# Patient Record
Sex: Female | Born: 1975 | Race: White | Hispanic: No | Marital: Married | Smoking: Never smoker
Health system: Southern US, Community
[De-identification: ages and names within clinical notes are randomized; demographics above are authoritative.]

## PROBLEM LIST (undated history)

## (undated) ENCOUNTER — Inpatient Hospital Stay (HOSPITAL_COMMUNITY): Payer: Self-pay

## (undated) DIAGNOSIS — D689 Coagulation defect, unspecified: Secondary | ICD-10-CM

## (undated) DIAGNOSIS — F419 Anxiety disorder, unspecified: Secondary | ICD-10-CM

## (undated) DIAGNOSIS — T7840XA Allergy, unspecified, initial encounter: Secondary | ICD-10-CM

## (undated) DIAGNOSIS — F329 Major depressive disorder, single episode, unspecified: Secondary | ICD-10-CM

## (undated) DIAGNOSIS — L900 Lichen sclerosus et atrophicus: Secondary | ICD-10-CM

## (undated) DIAGNOSIS — F32A Depression, unspecified: Secondary | ICD-10-CM

## (undated) DIAGNOSIS — Z98891 History of uterine scar from previous surgery: Secondary | ICD-10-CM

## (undated) HISTORY — DX: Anxiety disorder, unspecified: F41.9

## (undated) HISTORY — DX: Allergy, unspecified, initial encounter: T78.40XA

## (undated) HISTORY — DX: Major depressive disorder, single episode, unspecified: F32.9

## (undated) HISTORY — DX: Depression, unspecified: F32.A

---

## 1998-10-16 ENCOUNTER — Ambulatory Visit (HOSPITAL_COMMUNITY): Admission: RE | Admit: 1998-10-16 | Discharge: 1998-10-16 | Payer: Self-pay | Admitting: Family Medicine

## 1998-10-16 ENCOUNTER — Encounter: Payer: Self-pay | Admitting: Family Medicine

## 2004-01-05 ENCOUNTER — Ambulatory Visit: Payer: Self-pay | Admitting: Internal Medicine

## 2004-02-06 ENCOUNTER — Other Ambulatory Visit: Admission: RE | Admit: 2004-02-06 | Discharge: 2004-02-06 | Payer: Self-pay | Admitting: Obstetrics and Gynecology

## 2004-02-12 ENCOUNTER — Encounter: Admission: RE | Admit: 2004-02-12 | Discharge: 2004-02-12 | Payer: Self-pay | Admitting: Obstetrics and Gynecology

## 2005-04-11 ENCOUNTER — Other Ambulatory Visit: Admission: RE | Admit: 2005-04-11 | Discharge: 2005-04-11 | Payer: Self-pay | Admitting: Obstetrics and Gynecology

## 2005-08-08 ENCOUNTER — Ambulatory Visit: Payer: Self-pay | Admitting: Internal Medicine

## 2006-02-13 ENCOUNTER — Ambulatory Visit: Payer: Self-pay | Admitting: Internal Medicine

## 2006-08-04 ENCOUNTER — Encounter: Payer: Self-pay | Admitting: Internal Medicine

## 2006-08-13 ENCOUNTER — Ambulatory Visit: Payer: Self-pay | Admitting: Internal Medicine

## 2006-08-13 DIAGNOSIS — K120 Recurrent oral aphthae: Secondary | ICD-10-CM | POA: Insufficient documentation

## 2006-08-13 DIAGNOSIS — H8110 Benign paroxysmal vertigo, unspecified ear: Secondary | ICD-10-CM

## 2008-06-15 ENCOUNTER — Ambulatory Visit (HOSPITAL_COMMUNITY): Admission: RE | Admit: 2008-06-15 | Discharge: 2008-06-15 | Payer: Self-pay | Admitting: Gynecology

## 2008-06-28 ENCOUNTER — Ambulatory Visit: Payer: Self-pay | Admitting: Oncology

## 2008-07-05 LAB — CBC WITH DIFFERENTIAL (CANCER CENTER ONLY)
BASO#: 0.1 10*3/uL (ref 0.0–0.2)
EOS%: 5.4 % (ref 0.0–7.0)
MCH: 30.8 pg (ref 26.0–34.0)
MCHC: 34.6 g/dL (ref 32.0–36.0)
MONO%: 5.4 % (ref 0.0–13.0)
NEUT#: 4.2 10*3/uL (ref 1.5–6.5)
Platelets: 206 10*3/uL (ref 145–400)

## 2008-07-05 LAB — CMP (CANCER CENTER ONLY)
ALT(SGPT): 26 U/L (ref 10–47)
Alkaline Phosphatase: 68 U/L (ref 26–84)
CO2: 26 mEq/L (ref 18–33)
Sodium: 141 mEq/L (ref 128–145)
Total Bilirubin: 0.6 mg/dl (ref 0.20–1.60)
Total Protein: 8 g/dL (ref 6.4–8.1)

## 2008-07-10 LAB — HYPERCOAGULABLE PANEL, COMPREHENSIVE RET.
DRVVT: 39 secs (ref 36.1–47.0)
Homocysteine: 5.6 umol/L (ref 4.0–15.4)
Protein C Activity: 118 % (ref 75–133)
Protein S Activity: 86 % (ref 69–129)
Protein S Ag, Total: 107 % (ref 70–140)

## 2008-07-28 ENCOUNTER — Ambulatory Visit: Payer: Self-pay | Admitting: Family Medicine

## 2008-07-28 ENCOUNTER — Telehealth: Payer: Self-pay | Admitting: Family Medicine

## 2008-07-28 DIAGNOSIS — B354 Tinea corporis: Secondary | ICD-10-CM | POA: Insufficient documentation

## 2008-07-28 DIAGNOSIS — B009 Herpesviral infection, unspecified: Secondary | ICD-10-CM | POA: Insufficient documentation

## 2008-10-23 ENCOUNTER — Telehealth: Payer: Self-pay | Admitting: Family Medicine

## 2008-11-16 ENCOUNTER — Ambulatory Visit: Payer: Self-pay | Admitting: Oncology

## 2009-02-06 ENCOUNTER — Emergency Department (HOSPITAL_COMMUNITY): Admission: EM | Admit: 2009-02-06 | Discharge: 2009-02-06 | Payer: Self-pay | Admitting: Emergency Medicine

## 2009-03-16 ENCOUNTER — Ambulatory Visit: Payer: Self-pay | Admitting: Oncology

## 2009-03-20 LAB — CBC WITH DIFFERENTIAL (CANCER CENTER ONLY)
BASO#: 0.1 10*3/uL (ref 0.0–0.2)
BASO%: 0.8 % (ref 0.0–2.0)
EOS%: 6.5 % (ref 0.0–7.0)
Eosinophils Absolute: 0.6 10*3/uL — ABNORMAL HIGH (ref 0.0–0.5)
HCT: 41.2 % (ref 34.8–46.6)
HGB: 14 g/dL (ref 11.6–15.9)
LYMPH#: 2.7 10*3/uL (ref 0.9–3.3)
LYMPH%: 30.2 % (ref 14.0–48.0)
MCH: 30.7 pg (ref 26.0–34.0)
MCHC: 33.9 g/dL (ref 32.0–36.0)
MCV: 91 fL (ref 81–101)
MONO#: 0.5 10*3/uL (ref 0.1–0.9)
MONO%: 5.5 % (ref 0.0–13.0)
NEUT#: 5 10*3/uL (ref 1.5–6.5)
NEUT%: 57 % (ref 39.6–80.0)
Platelets: 196 10*3/uL (ref 145–400)
RBC: 4.55 10*6/uL (ref 3.70–5.32)
RDW: 11.4 % (ref 10.5–14.6)
WBC: 8.8 10*3/uL (ref 3.9–10.0)

## 2009-03-25 LAB — HYPERCOAGULABLE PANEL, COMPREHENSIVE
AntiThromb III Func: 112 % (ref 76–126)
Anticardiolipin IgA: <3 APL U/mL (ref <10)
Anticardiolipin IgG: 3 GPL U/mL (ref <10)
Anticardiolipin IgM: <3 MPL U/mL (ref <10)
Beta-2 Glyco I IgG: <3 U/mL (ref <=15)
Beta-2-Glycoprotein I IgA: <3 U/mL (ref <=15)
Beta-2-Glycoprotein I IgM: <3 U/mL (ref <=15)
DRVVT: 40.3 secs (ref 36.2–44.3)
Homocysteine: 4.2 umol/L (ref 4.0–15.4)
PTT Lupus Anticoagulant: 36.9 secs (ref 32.0–43.4)
Protein C Activity: 176 % — ABNORMAL HIGH (ref 75–133)
Protein C, Total: 111 % (ref 70–140)
Protein S Activity: 81 % (ref 69–129)
Protein S Ag, Total: 107 % (ref 70–140)

## 2009-05-31 ENCOUNTER — Ambulatory Visit: Payer: Self-pay | Admitting: Internal Medicine

## 2009-05-31 DIAGNOSIS — L738 Other specified follicular disorders: Secondary | ICD-10-CM | POA: Insufficient documentation

## 2009-05-31 DIAGNOSIS — J309 Allergic rhinitis, unspecified: Secondary | ICD-10-CM | POA: Insufficient documentation

## 2009-06-01 ENCOUNTER — Telehealth: Payer: Self-pay | Admitting: Internal Medicine

## 2009-06-12 ENCOUNTER — Telehealth: Payer: Self-pay | Admitting: Internal Medicine

## 2009-09-04 ENCOUNTER — Telehealth: Payer: Self-pay | Admitting: Internal Medicine

## 2010-02-12 NOTE — Progress Notes (Signed)
Summary: Lodrane  Phone Note Call from Patient   Caller: Patient Call For: Gordy Savers  MD Summary of Call: [facility] ([place]) Pt likes the Lodrane and would like a prescription, please.  Initial call taken by: Lynann Beaver CMA,  September 04, 2009 9:19 AM  Follow-up for Phone Call        OK  #50  one two times a day as needed  RF 6 Follow-up by: Gordy Savers  MD,  September 04, 2009 12:38 PM    New/Updated Medications: LODRANE 12 HOUR 6 MG XR12H-TAB (BROMPHENIRAMINE MALEATE) one by mouth bid Prescriptions: LODRANE 12 HOUR 6 MG XR12H-TAB (BROMPHENIRAMINE MALEATE) one by mouth bid  #50 x 6   Entered by:   Lynann Beaver CMA   Authorized by:   Gordy Savers  MD   Signed by:   Lynann Beaver CMA on 09/04/2009   Method used:   Electronically to        [id number]* (retail)       [address]       [place], [place]  [address]       Ph: [phone]       Fax: [phone]   RxID:   [id number]   Appended Document: [place]    Prescriptions: LODRANE 12 HOUR 6 MG XR12H-TAB (BROMPHENIRAMINE MALEATE) one by mouth bid  #50 x 6   Entered by:   Lynann Beaver CMA   Authorized by:   Gordy Savers  MD   Signed by:   Lynann Beaver CMA on 09/06/2009   Method used:   Electronically to        [facility] [id number] (retail)       [address]       [place], [place]  [address]       Ph: [phone] or [phone]       Fax: [phone]   RxID:   [id number]  [facility] did not have this medication.

## 2010-02-12 NOTE — Assessment & Plan Note (Signed)
Summary: ?allergies/njr   Vital Signs:  Patient profile:   35 year old female Weight:      214 pounds Temp:     98.1 degrees F oral BP sitting:   130 / 74  (left arm) Cuff size:   regular  Vitals Entered By: Duard Brady LPN (May 31, 2009 4:41 PM) CC: c/o allergy sx , benadryl not helping , also c/o 'knot' in groin area Is Patient Diabetic? No   CC:  c/o allergy sx , benadryl not helping , and also c/o 'knot' in groin area.  History of Present Illness: 5 -year-old patient who is in today complaining of worsening allergies over the past 4 days.  She seemed somewhat improved today and has been using Benadryl.  This week.  She has benefited greatly from Flonase nasal spray and attached. She also has concerns about scattered areas of folliculitis in the groin region. She has been burred to [facility] for further evaluation of frequent miscarriages  Preventive Screening-Counseling & Management  Alcohol-Tobacco     Smoking Status: never  Allergies (verified): No Known Drug Allergies  Past History:  Past Medical History: Allergic rhinitis recurrent miscarriages  Social History: Smoking Status:  never  Physical Exam  General:  overweight-appearing.   Head:  Normocephalic and atraumatic without obvious abnormalities. No apparent alopecia or balding. Eyes:  No corneal or conjunctival inflammation noted. EOMI. Perrla. Funduscopic exam benign, without hemorrhages, exudates or papilledema. Vision grossly normal. Ears:  External ear exam shows no significant lesions or deformities.  Otoscopic examination reveals clear canals, tympanic membranes are intact bilaterally without bulging, retraction, inflammation or discharge. Hearing is grossly normal bilaterally. Nose:  External nasal examination shows no deformity or inflammation. Nasal mucosa are pink and moist without lesions or exudates. Mouth:  Oral mucosa and oropharynx without lesions or exudates.  Teeth  in good repair. Neck:  No deformities, masses, or tenderness noted. Lungs:  Normal respiratory effort, chest expands symmetrically. Lungs are clear to auscultation, no crackles or wheezes. Heart:  Normal rate and regular rhythm. S1 and S2 normal without gallop, murmur, click, rub or other extra sounds. Skin:  scattered areas of folliculitis in the right groin and pubic areas    Impression & Recommendations:  Problem # 1:  ALLERGIC RHINITIS (ICD-477.9)  Her updated medication list for this problem includes:    Fluticasone Propionate 50 Mcg/act Susp (Fluticasone propionate) ..... Use daily will add a  once a day nonsedating antihistamine  Problem # 2:  FOLLICULITIS (ICD-704.8) local skin care discussed  Complete Medication List: 1)  Valtrex 1 Gm Tabs (Valacyclovir hcl) .... Use as directed as needed cold sores 2)  Folic Acid 800 Mcg Tabs (Folic acid) .... Qd 3)  Vitamin B-12 100 Mcg Tabs (Cyanocobalamin) .... Qd 4)  Aspir-low 81 Mg Tbec (Aspirin) .... Qd 5)  Progesterone  .... When ovulating 6)  Lovenox  .... As directed 7)  Fluticasone Propionate 50 Mcg/act Susp (Fluticasone propionate) .... Use daily  Patient Instructions: 1)  Please schedule a follow-up appointment as needed. Prescriptions: FLUTICASONE PROPIONATE 50 MCG/ACT SUSP (FLUTICASONE PROPIONATE) use daily  #1 x 4   Entered and Authorized by:   Gordy Savers  MD   Signed by:   Gordy Savers  MD on 05/31/2009   Method used:   Electronically to        [id number] (retail)       [address]  [place], [place]  [address]       Ph: [phone]       Fax: [phone]   RxID:   [id number]

## 2010-02-12 NOTE — Progress Notes (Signed)
Summary: staph  Phone Note Call from Patient   Summary of Call: Went to dermatologist for places on bikini line, got very painful & were lanced,  turned out to be staph, on 2nd round of antibiotic - doxy & something related to Penicillin that starts with c or k by sound.   Wanted this in her file in case she comes back & has something similar.   Initial call taken by: Rudy Jew, RN,  Jun 12, 2009 4:21 PM  Follow-up for Phone Call        noted Follow-up by: Gordy Savers  MD,  Jun 12, 2009 5:23 PM

## 2010-02-12 NOTE — Progress Notes (Signed)
Summary: sig clarification flonase  Phone Note Refill Request   Refills Requested: Medication #1:  FLUTICASONE PROPIONATE 50 MCG/ACT SUSP use daily. sig [facility] -   [facility] [address]  [phone]   Method Requested: Fax to Local Pharmacy Initial call taken by: Duard Brady LPN,  Jun 01, 2009 11:03 AM    New/Updated Medications: FLUTICASONE PROPIONATE 50 MCG/ACT SUSP (FLUTICASONE PROPIONATE) 2 sprays once daily each nostril Prescriptions: FLUTICASONE PROPIONATE 50 MCG/ACT SUSP (FLUTICASONE PROPIONATE) 2 sprays once daily each nostril  #16gm x 4   Entered by:   Duard Brady LPN   Authorized by:   Gordy Savers  MD   Signed by:   Duard Brady LPN on 01/15/7251   Method used:   Faxed to ...       [facility] [address] (retail)       [address]       [place], [place]  [address]       Ph: [phone]       Fax: [phone]   RxID:   [id number]  [facility]ed clarification to [facility]

## 2010-04-01 LAB — HCG, QUANTITATIVE, PREGNANCY: hCG, Beta Chain, Quant, S: 107 m[IU]/mL — ABNORMAL HIGH (ref <5)

## 2010-04-01 LAB — TYPE AND SCREEN: Antibody Screen: NEGATIVE

## 2010-06-05 ENCOUNTER — Other Ambulatory Visit: Payer: Self-pay | Admitting: Obstetrics and Gynecology

## 2010-06-05 ENCOUNTER — Inpatient Hospital Stay (HOSPITAL_COMMUNITY)
Admission: AD | Admit: 2010-06-05 | Discharge: 2010-06-09 | DRG: 651 | Disposition: A | Discharge status: Home / Self Care | Payer: BC Managed Care – PPO | Source: Ambulatory Visit | Attending: Obstetrics & Gynecology | Admitting: Obstetrics & Gynecology

## 2010-06-05 DIAGNOSIS — O1414 Severe pre-eclampsia complicating childbirth: Principal | ICD-10-CM | POA: Diagnosis present

## 2010-06-05 LAB — CBC
MCH: 31.1 pg (ref 26.0–34.0)
Platelets: 121 10*3/uL — ABNORMAL LOW (ref 150–400)
RBC: 4.37 MIL/uL (ref 3.87–5.11)
RDW: 13.6 % (ref 11.5–15.5)
WBC: 10.7 10*3/uL — ABNORMAL HIGH (ref 4.0–10.5)

## 2010-06-05 LAB — COMPREHENSIVE METABOLIC PANEL
AST: 66 U/L — ABNORMAL HIGH (ref 0–37)
Albumin: 2.3 g/dL — ABNORMAL LOW (ref 3.5–5.2)
BUN: 24 mg/dL — ABNORMAL HIGH (ref 6–23)
Chloride: 102 mEq/L (ref 96–112)
Creatinine, Ser: 1.21 mg/dL — ABNORMAL HIGH (ref 0.4–1.2)
GFR calc Af Amer: >60 mL/min (ref >60)
Potassium: 4.1 mEq/L (ref 3.5–5.1)
Total Bilirubin: 0.7 mg/dL (ref 0.3–1.2)
Total Protein: 6.6 g/dL (ref 6.0–8.3)

## 2010-06-05 LAB — APTT: aPTT: 38 seconds — ABNORMAL HIGH (ref 24–37)

## 2010-06-05 LAB — MRSA PCR SCREENING

## 2010-06-05 LAB — LACTATE DEHYDROGENASE: LDH: 242 U/L (ref 94–250)

## 2010-06-05 LAB — MAGNESIUM: Magnesium: 5.9 mg/dL — ABNORMAL HIGH (ref 1.5–2.5)

## 2010-06-05 LAB — PROTIME-INR: Prothrombin Time: 12.6 seconds (ref 11.6–15.2)

## 2010-06-06 LAB — MAGNESIUM: Magnesium: 5.9 mg/dL — ABNORMAL HIGH (ref 1.5–2.5)

## 2010-06-06 LAB — COMPREHENSIVE METABOLIC PANEL
Albumin: 1.9 g/dL — ABNORMAL LOW (ref 3.5–5.2)
BUN: 21 mg/dL (ref 6–23)
Calcium: 7.8 mg/dL — ABNORMAL LOW (ref 8.4–10.5)
Creatinine, Ser: 1.24 mg/dL — ABNORMAL HIGH (ref 0.4–1.2)
Glucose, Bld: 109 mg/dL — ABNORMAL HIGH (ref 70–99)
Potassium: 5.4 mEq/L — ABNORMAL HIGH (ref 3.5–5.1)
Total Protein: 5.6 g/dL — ABNORMAL LOW (ref 6.0–8.3)

## 2010-06-06 LAB — CBC
Hemoglobin: 10.2 g/dL — ABNORMAL LOW (ref 12.0–15.0)
MCH: 30.8 pg (ref 26.0–34.0)
Platelets: 128 10*3/uL — ABNORMAL LOW (ref 150–400)
RBC: 3.31 MIL/uL — ABNORMAL LOW (ref 3.87–5.11)
WBC: 16.7 10*3/uL — ABNORMAL HIGH (ref 4.0–10.5)

## 2010-06-06 LAB — URIC ACID: Uric Acid, Serum: 7.3 mg/dL — ABNORMAL HIGH (ref 2.4–7.0)

## 2010-06-06 LAB — LACTATE DEHYDROGENASE: LDH: 290 U/L — ABNORMAL HIGH (ref 94–250)

## 2010-06-06 LAB — RPR: RPR Ser Ql: NONREACTIVE

## 2010-06-07 LAB — CBC
Hemoglobin: 9 g/dL — ABNORMAL LOW (ref 12.0–15.0)
MCH: 30.4 pg (ref 26.0–34.0)
MCHC: 31.8 g/dL (ref 30.0–36.0)
RDW: 14.5 % (ref 11.5–15.5)

## 2010-06-07 LAB — COMPREHENSIVE METABOLIC PANEL
AST: 48 U/L — ABNORMAL HIGH (ref 0–37)
CO2: 20 mEq/L (ref 19–32)
Calcium: 7.9 mg/dL — ABNORMAL LOW (ref 8.4–10.5)
Creatinine, Ser: 1.33 mg/dL — ABNORMAL HIGH (ref 0.4–1.2)
GFR calc Af Amer: 56 mL/min — ABNORMAL LOW (ref >60)
GFR calc non Af Amer: 46 mL/min — ABNORMAL LOW (ref >60)
Glucose, Bld: 78 mg/dL (ref 70–99)
Total Protein: 5.7 g/dL — ABNORMAL LOW (ref 6.0–8.3)

## 2010-06-07 LAB — URIC ACID: Uric Acid, Serum: 7.7 mg/dL — ABNORMAL HIGH (ref 2.4–7.0)

## 2010-06-08 LAB — COMPREHENSIVE METABOLIC PANEL
ALT: 38 U/L — ABNORMAL HIGH (ref 0–35)
Albumin: 2 g/dL — ABNORMAL LOW (ref 3.5–5.2)
Alkaline Phosphatase: 282 U/L — ABNORMAL HIGH (ref 39–117)
Chloride: 105 mEq/L (ref 96–112)
Potassium: 5 mEq/L (ref 3.5–5.1)
Sodium: 136 mEq/L (ref 135–145)
Total Protein: 5.6 g/dL — ABNORMAL LOW (ref 6.0–8.3)

## 2010-06-08 LAB — MRSA CULTURE

## 2010-06-14 NOTE — H&P (Signed)
  Mariah Brown, Mariah Brown            ACCOUNT NO.:  [id number]  MEDICAL RECORD NO.:  [id number]           PATIENT TYPE:  I  LOCATION:  9372                          [facility]  PHYSICIAN:  Lenoard Aden, M.D.DATE OF BIRTH:  Sep 10, 1975  DATE OF ADMISSION:  06/05/2010 DATE OF DISCHARGE:                             HISTORY & PHYSICAL   CHIEF COMPLAINT:  HELLP.  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  She has no known latex allergy.  ALLERGIES:  To MACROBID.  PAST MEDICAL HISTORY:  She has a medical remarkable for anxiety, recurrent pregnancy loss, and PAI-1 mutation.  She has a past medical history also remarkable for elective sclerosis. SOCIAL HISTORY:  Noncontributory.  She is a nonsmoker, nondrinker.  She denies domestic or physical violence.  MEDICATIONS:  Prenatal vitamins and unfractionated heparin, last dose greater than 12 hours ago 10,000 units t.i.d., previously taken.  She has a history of 5 previous pregnancy losses without D and C.  SURGICAL HISTORY:  Noncontributory.  Prenatal course is complicated by size discrepancy and new-onset elevated blood pressure, proteinuria, and abnormal labs as noted.  PHYSICAL EXAMINATION:  GENERAL:  She is a well-developed, well-nourished white female in no acute distress. HEENT:  Normal. NECK:  Supple.  Full range of motion. LUNGS:  Clear. HEART:  Regular rhythm. ABDOMEN:  Soft, gravid, and nontender.  No CVA tenderness. EXTREMITIES:  No cords. NEUROLOGICAL:  Exam is nonfocal. SKIN:  Intact. EXTREMITIES:  DTRs 2+.  No evidence of clonus, 1+ pretibial edema noted. PELVIC:  Cervix closed, thick, vertex and -2 to -3.  LABORATORY DATA:  Laboratory  values reveal platelet count of 121,000 decreased from 140,000 twelve hours ago.  Uric acid 7.3 up from 7.  LDH of 242.  PT/PTT normal.  LFTs now reveal an SGOT of 66, SGPT of 53, and a creatinine of 1.2.  NST is reactive.  IMPRESSION: 1. This is a 35-3/7th week intrauterine pregnancy. 2. New-onset preeclampsia with probable hemolysis, elevated liver     enzymes, and low platelet syndrome. 3. Unfavorable cervix.  PLAN:  Options discussed.  Case discussed with Dr. Particia Nearing of MFM. She concurs with the management option to include proceeding with delivery at this time.  Options of induction versus primary C-section discussed with potential high chance of needing a potential C-section due to unfavorable cervix and clinical scenario.  The patient acknowledges these options, consents were signed, and she desires to proceed with C-section.  Risks, benefits discussed.  Risks of anesthesia, infection, bleeding, injury to abdominal organs, and need for repair discussed, delayed versus immediate complications to include bowel and bladder injury noted.  Patient acknowledges and wishes to proceed.     Lenoard Aden, M.D.     RJT/MEDQ  D:  06/05/2010  T:  06/06/2010  Job:  [id number]  Electronically Signed by Olivia Mackie M.D. on 06/14/2010 02:02:13 PM

## 2010-06-14 NOTE — Op Note (Signed)
  Mariah Brown, Mariah Brown            ACCOUNT NO.:  [id number]  MEDICAL RECORD NO.:  [id number]           PATIENT TYPE:  I  LOCATION:  9372                          [facility]  PHYSICIAN:  Lenoard Aden, M.D.DATE OF BIRTH:  08-16-1975  DATE OF PROCEDURE:  06/05/2010 DATE OF DISCHARGE:                              OPERATIVE REPORT   PREOPERATIVE DIAGNOSIS:  35 and 3/7th week intrauterine pregnancy with new onset of severe preeclampsia with HELLP syndrome.  POSTOPERATIVE DIAGNOSIS:  35 and 3/7th week intrauterine pregnancy with new onset of severe preeclampsia with HELLP syndrome.  PROCEDURE:  Primary low-segment transverse cesarean section.  SURGEON:  Lenoard Aden, MD  ASSISTANT:  Marlinda Mike, CNM  ANESTHESIA:  Spinal by Dr. Jean Rosenthal.  ESTIMATED BLOOD LOSS:  1000 mL.  COMPLICATIONS:  None.  DRAINS:  Foley counts correct.  The patient recovery in good condition.  BRIEF OPERATIVE NOTE:  After being apprised of risks of anesthesia, infection, bleeding, injury to abdominal organs, need for repair, delayed versus immediate complications to include bowel and bladder injury, possible need for repair, the patient was brought to the operating room, she was administered spinal anesthetic without complications.  She was prepped and draped in usual sterile fashion. Foley catheter placed after achieving adequate anesthesia dilute Marcaine solution placed.  A Pfannenstiel skin incision made with the scalpel, carried down to fascia which was nicked in midline and opened transversely using Mayo scissors.  Rectus muscles dissected sharply in the midline.  Peritoneum entered sharply.  Bladder blade placed. Visceral peritoneum scored sharply off the lower uterine segment.  Sharl Ma hysterotomy incision made with atraumatic delivery of full-term living female, handed to pediatricians in attendance.  Apgars 6 and 8.  Cord blood collected.  Placenta delivered manually intact.   Uterus exteriorized, curetted using dry lap pack and closed in 2 running imbricating layers of 0 Monocryl suture.  Good hemostasis was noted. Bladder flap inspected, found to be hemostatic.  Irrigation accomplished.  Urine output is adequate and urine is clear.  At this time, uterus replaced in abdominal cavity.  Uterus normal size, shape, and contour.  Both tubes and ovaries appear normal.  The bladder flap and uterine incision reinspected, found to be hemostatic.  The fascia was then closed using 0-Monocryl in a running fashion.  Subcutaneous tissue reapproximated using 0-plain in a continuous running fashion.  Skin closed using staples.  The patient tolerated the procedure well and was transferred to recovery in good condition.     Lenoard Aden, M.D.     RJT/MEDQ  D:  06/05/2010  T:  06/06/2010  Job:  [id number]  Electronically Signed by Olivia Mackie M.D. on 06/14/2010 02:02:15 PM

## 2010-07-04 ENCOUNTER — Ambulatory Visit (HOSPITAL_COMMUNITY)
Admission: RE | Admit: 2010-07-04 | Discharge: 2010-07-04 | Disposition: A | Discharge status: Home / Self Care | Payer: BC Managed Care – PPO | Source: Ambulatory Visit | Attending: Obstetrics and Gynecology | Admitting: Obstetrics and Gynecology

## 2010-07-09 ENCOUNTER — Ambulatory Visit (HOSPITAL_COMMUNITY)
Admission: RE | Admit: 2010-07-09 | Discharge: 2010-07-09 | Disposition: A | Discharge status: Home / Self Care | Payer: BC Managed Care – PPO | Source: Ambulatory Visit | Attending: Obstetrics and Gynecology | Admitting: Obstetrics and Gynecology

## 2010-07-19 NOTE — Discharge Summary (Signed)
Mariah Brown, Mariah Brown            ACCOUNT NO.:  [id number]  MEDICAL RECORD NO.:  [id number]           PATIENT TYPE:  I  LOCATION:  9320                          [facility]  PHYSICIAN:  Lenoard Aden, M.D.DATE OF BIRTH:  [birth year]  DATE OF ADMISSION:  06/05/2010 DATE OF DISCHARGE:  06/09/2010                              DISCHARGE SUMMARY   ADMISSION DIAGNOSES: 1. Intrauterine pregnancy at 35 weeks and 3 days' gestation. 2. New-onset severe preeclampsia with hemolysis, elevated liver     enzymes, and low platelet syndrome.  DISCHARGE DIAGNOSES: 1. Postoperative day #4 status post primary cesarean section, stable. 2. Resolving hemolysis, elevated liver enzymes, and low platelet.  HISTORY:  The patient is a gravida 6, para 0-0-5-0 at 35 weeks and 3 days' gestation with [facility] of July 07, 2010.  Prenatal care was obtained at [facility] since 8 weeks and 2 days' gestation with primary as Dr. Billy Coast.  PRENATAL LABORATORY DATA:  O positive, rubella immune, GBS positive,  HIV negative, RPR non-reactive, Hep. B negative, 1hr GTT 114.  Prenatal course complicated by history of multiple pregnancy losses and recent diagnosis of PAI-1 mutation, on anticoagulant therapy with heparin.  MEDICAL/SURGICAL HISTORY: 1. Anxiety. 2. Recurrent pregnancy loss. 3. PAI-1 mutation.  ALLERGIES:  The patient is allergic to [facility].  CURRENT MEDICATIONS:  Prenatal vitamins and heparin 10,000 units t.i.d. subcutaneous.  PRESENTATION:  The patient presented for direct admit on Jun 05, 2010, with new onset of headache, nausea, abnormal liver function tests, and low platelets.  The patient had a reactive nonstress test and unfavorable cervix.  ADMISSION LABORATORY DATA:  WBCs 10.7, hemoglobin 13.6, hematocrit 41.0, and platelets 121 which had dropped from 140 twelve hours previous.  AST was 66, ALT 53, uric acid 7.3 - an increase from 7.0 twelve hours earlier. Creatinine was 1.21.  LDH 242.  PT  12.6, PTT 38, and INR of 0.92.  RPR negative and MRSA by PCR negative.  Maternal Fetal Medicine was consulted, Dr. Particia Nearing concurred with decision to proceed with expedited delivery at this time for severe preeclampsia and developing HELLP syndrome.  The patient was counseled of risks and benefits of cesarean section versus induction of labor and agreed with proceeding to delivery with a cesarean section.  SURGERY:  The patient was delivered via primary cesarean section on Jun 05, 2010, by Dr. Billy Coast for severe preeclampsia and HELLP syndrome with unfavorable cervix.  The patient was delivered of a female infant with a weight of 5 pounds and 7 ounces and Apgars of 6 and 8 and the newborn was transferred to NICU.  Please see operative note for further details.  POST-OP COURSE: was complicated by HELLP syndrome.  The patient was admitted to a ICU post recovery and was started on magnesium sulfate at 4 g loading dose and then 2 g an hour.  Magnesium level at 2200 the same evening was found to be 5.9 and magnesium was dropped to 1 g per hour. On postoperative day #1, WBCs were 16.7, hemoglobin 10.2, hematocrit 31.5, and platelets 128.  Creatinine increased to 1.24.  AST and ALT were 60 and 49.  LDH 290 and uric acid at 7.3.  The patient was stable in AICU, on magnesium sulfate, and urine output was showing good diuresis.  The patient was transferred to Step-Down Unit after 24 hours of observation and the magnesium sulfate infusion was discontinued at that time.  On postoperative day #2, WBCs were 13.7, hemoglobin 9.0, hematocrit 28.3, and platelets increased to 167.  AST and ALT dropped to 48 and 44 and uric acid remained high at 7.7.  By postoperative day #3, AST and ALT were dropping down to 48 and 38 respectively and uric acid decreased to 6.6.  The patient remained stable for the rest of the hospital stay.  BP ranges were 111-128/68-81 with an one-time blood pressure 143/97.  She  remained afebrile and physical exam was within normal limits.  The patient had dependent edema in the lower abdomen, mons pubis area, and lower extremities that were decreasing by postoperative day #4.  Wound was well approximated with staples.  No erythema and faint ecchymosis at site with mild edema surrounding incisions.  Newborn remained in NICU during hospital stay with the patient initiating breast pumping and breastfeeding once infant was able to latch.  Infant did not have circumcision during hospital stay and remained in NICU at the discharge time.  DISCHARGE CARE AND INSTRUCTIONS:  The patient was discharged home in stable condition and improving on postoperative day #4.  Staples were removed prior to discharge and replaced with Steri-Strips.  DIET:  Regular.  ACTIVITY:  Ad lib with postop weightlifting restrictions x2 weeks and instructions per [facility] booklet.  DISCHARGE MEDICATIONS: 1. Prenatal vitamins 1 tablet p.o. daily. 2. Ibuprofen 800 mg p.o. q.8 h. 3. Tylenol 1-2 tablets p.o. q.4-6 h. p.r.n. for pain. 4. Nu-Iron 150 one tablet p.o. daily.  FOLLOWUP:  In 5 days at [facility].    ______________________________ Arlan Organ, CNM   ______________________________ Lenoard Aden, M.D.    DP/MEDQ  D:  06/09/2010  T:  06/10/2010  Job:  [id number]  Electronically Signed by Arlan Organ CNM on 06/16/2010 02:08:45 PM Electronically Signed by Olivia Mackie M.D. on 07/19/2010 07:30:53 AM

## 2010-07-22 ENCOUNTER — Other Ambulatory Visit: Payer: Self-pay | Admitting: Obstetrics and Gynecology

## 2010-07-31 ENCOUNTER — Ambulatory Visit (HOSPITAL_COMMUNITY)
Admission: RE | Admit: 2010-07-31 | Discharge: 2010-07-31 | Disposition: A | Discharge status: Home / Self Care | Payer: BC Managed Care – PPO | Source: Ambulatory Visit | Attending: Obstetrics & Gynecology | Admitting: Obstetrics & Gynecology

## 2010-07-31 NOTE — Progress Notes (Deleted)
Last feeding prior to  appointment 11:30am  Per mom fed both sides . Consult 2 hrs later ,infant awake ,alert and rooting . Latched on Left breast well  , obtain depth ,consistent pattern and  Swallows, mom comfortable . TOOK in 30ml left breast . Second breast worked on obtaining depth ,more consistent pattern and swallows . (Per mom comfortable "It didn't hurt at all ". RT  breast  Yield = 15ml .Total amt for feeding = 48 ml .  Assessed both breast for any plug ducts .none noted . LC. Suspects with improved depth ,changing positions with help empty breast better ,Also due to only 2hrs since last feeding infant not overly hungery .Infant seems satisfied after feeding and content .  LC [facility]

## 2010-07-31 NOTE — Progress Notes (Signed)
Last feeding prior to  appointment 11:30am  Per mom fed both sides . Consult 2 hrs later ,infant awake ,alert and rooting . Latched on Left breast well  , obtain depth ,consistent pattern and  Swallows, mom comfortable . TOOK in 30ml left breast . Second breast worked on obtaining depth ,more consistent pattern and swallows . (Per mom comfortable "It didn't hurt at all ". RT  breast  Yield = 15ml .Total amt for feeding = 48 ml .  Assessed both breast for any plug ducts .none noted . LC. Suspects with improved depth ,changing positions with help empty breast better ,Also due to only 2hrs since last feeding infant not overly hungery .Infant seems satisfied after feeding and content .  LC recommended F/U this Tues. July 24th [facility] .Addendum : per mom feeding 9-11x's per day and on demand . Voids= >10 diapers and stools yellowish /brown several per day .

## 2011-02-20 ENCOUNTER — Ambulatory Visit (HOSPITAL_COMMUNITY): Payer: BC Managed Care – PPO

## 2011-02-20 ENCOUNTER — Ambulatory Visit (HOSPITAL_COMMUNITY): Admission: RE | Admit: 2011-02-20 | Payer: BC Managed Care – PPO | Source: Ambulatory Visit

## 2011-11-27 ENCOUNTER — Encounter: Payer: Self-pay | Admitting: Internal Medicine

## 2011-11-27 ENCOUNTER — Ambulatory Visit (INDEPENDENT_AMBULATORY_CARE_PROVIDER_SITE_OTHER): Payer: BC Managed Care – PPO | Admitting: Internal Medicine

## 2011-11-27 VITALS — BP 130/80 | HR 84 | Temp 98.0°F | Resp 18 | Wt 226.0 lb

## 2011-11-27 DIAGNOSIS — R109 Unspecified abdominal pain: Secondary | ICD-10-CM

## 2011-11-27 NOTE — Patient Instructions (Signed)
Align-  1 daily for 2 weeks  Call or return to clinic prn if these symptoms worsen or fail to improve as anticipated.

## 2011-11-27 NOTE — Progress Notes (Signed)
Subjective:    Patient ID: Mariah Brown, female    DOB: 1975-02-06, 36 y.o.   MRN: [id number]  HPI  36 year old patient presents with a 2 week history of abdominal pain and nausea. She has intermittent nausea throughout the day and also complains of some postprandial abdominal discomfort. The pain is more in the right lower cautery. No vomiting. She states her bowel habits or a little abnormal with a different quality of the stool and perhaps slightly more constipated. She does describe some situational stress. She does have an 35-month-old child and 2 stepsons. She is a Arts development officer. She had a normal menstrual cycle 2 weeks ago  No past medical history on file.  History   Social History  . Marital Status: Married    Spouse Name: N/A    Number of Children: N/A  . Years of Education: N/A   Occupational History  . Not on file.   Social History Main Topics  . Smoking status: Never Smoker   . Smokeless tobacco: Never Used  . Alcohol Use: Not on file  . Drug Use: Not on file  . Sexually Active: Not on file   Other Topics Concern  . Not on file   Social History Narrative  . No narrative on file    No past surgical history on file.  No family history on file.  No Known Allergies  No current outpatient prescriptions on file prior to visit.    BP 130/80  Pulse 84  Temp 98 F (36.7 C) (Oral)  Resp 18  Wt 226 lb (102.513 kg)  SpO2 98%  LMP 11/13/2011       Review of Systems  Constitutional: Negative.   HENT: Negative for hearing loss, congestion, sore throat, rhinorrhea, dental problem, sinus pressure and tinnitus.   Eyes: Negative for pain, discharge and visual disturbance.  Respiratory: Negative for cough and shortness of breath.   Cardiovascular: Negative for chest pain, palpitations and leg swelling.  Gastrointestinal: Positive for nausea, abdominal pain and constipation. Negative for vomiting, diarrhea, blood in stool and abdominal distention.    Genitourinary: Negative for dysuria, urgency, frequency, hematuria, flank pain, vaginal bleeding, vaginal discharge, difficulty urinating, vaginal pain and pelvic pain.  Musculoskeletal: Negative for joint swelling, arthralgias and gait problem.  Skin: Negative for rash.  Neurological: Negative for dizziness, syncope, speech difficulty, weakness, numbness and headaches.  Hematological: Negative for adenopathy.  Psychiatric/Behavioral: Negative for behavioral problems, dysphoric mood and agitation. The patient is not nervous/anxious.        Objective:   Physical Exam  Constitutional: She is oriented to person, place, and time. She appears well-developed and well-nourished. No distress.       Blood pressure normal Afebrile  HENT:  Head: Normocephalic.  Right Ear: External ear normal.  Left Ear: External ear normal.  Mouth/Throat: Oropharynx is clear and moist.  Eyes: Conjunctivae normal and EOM are normal. Pupils are equal, round, and reactive to light.  Neck: Normal range of motion. Neck supple. No thyromegaly present.  Cardiovascular: Normal rate, regular rhythm, normal heart sounds and intact distal pulses.   Pulmonary/Chest: Effort normal and breath sounds normal.  Abdominal: Soft. Bowel sounds are normal. She exhibits no distension and no mass. There is no tenderness. There is no rebound and no guarding.       Mild obesity  Musculoskeletal: Normal range of motion.  Lymphadenopathy:    She has no cervical adenopathy.  Neurological: She is alert and oriented to person, place, and time.  Skin: Skin is warm and dry. No rash noted.  Psychiatric: Her behavior is normal.       Slightly tearful          Assessment & Plan:   2 week  history of abdominal pain usually postprandial and intermittent nausea. This has been associated with some change in her bowel habits. Seems to have some increased situational stress. Will treat with probiotic for 2 weeks and observe. If symptoms  intensify or fail to improve we'll set up for abdominal ultrasound

## 2012-03-08 ENCOUNTER — Telehealth: Payer: Self-pay | Admitting: Internal Medicine

## 2012-03-08 NOTE — Telephone Encounter (Signed)
ok 

## 2012-03-08 NOTE — Telephone Encounter (Signed)
Ok with me if ok with PCP 

## 2012-03-08 NOTE — Telephone Encounter (Signed)
Pt would like to switch to Dr Selena Batten, if that is OK with you? Dr Selena Batten, Pt would like to switch from Dr Kirtland Bouchard, Is that OK? Pt would like a female Dr, that 's all.

## 2012-03-15 ENCOUNTER — Encounter: Payer: BC Managed Care – PPO | Admitting: Family Medicine

## 2012-03-15 ENCOUNTER — Ambulatory Visit: Payer: BC Managed Care – PPO | Admitting: Family Medicine

## 2012-03-15 NOTE — Progress Notes (Signed)
This encounter was created in error - please disregard.

## 2012-07-19 ENCOUNTER — Encounter: Payer: Self-pay | Admitting: Family Medicine

## 2012-07-19 ENCOUNTER — Ambulatory Visit (INDEPENDENT_AMBULATORY_CARE_PROVIDER_SITE_OTHER): Payer: BC Managed Care – PPO | Admitting: Family Medicine

## 2012-07-19 VITALS — BP 104/70 | Temp 98.4°F | Wt 224.0 lb

## 2012-07-19 DIAGNOSIS — R1011 Right upper quadrant pain: Secondary | ICD-10-CM

## 2012-07-19 LAB — COMPREHENSIVE METABOLIC PANEL
AST: 18 U/L (ref 0–37)
Albumin: 4.2 g/dL (ref 3.5–5.2)
Alkaline Phosphatase: 57 U/L (ref 39–117)
BUN: 16 mg/dL (ref 6–23)
Potassium: 4.8 mEq/L (ref 3.5–5.1)
Sodium: 136 mEq/L (ref 135–145)

## 2012-07-19 LAB — CBC WITH DIFFERENTIAL/PLATELET
Basophils Relative: 0.4 % (ref 0.0–3.0)
Eosinophils Absolute: 0.2 10*3/uL (ref 0.0–0.7)
HCT: 39.2 % (ref 36.0–46.0)
Lymphs Abs: 2.5 10*3/uL (ref 0.7–4.0)
MCHC: 33.1 g/dL (ref 30.0–36.0)
MCV: 90.8 fl (ref 78.0–100.0)
Monocytes Absolute: 0.6 10*3/uL (ref 0.1–1.0)
Neutro Abs: 4.7 10*3/uL (ref 1.4–7.7)
Neutrophils Relative %: 57.9 % (ref 43.0–77.0)
RBC: 4.32 Mil/uL (ref 3.87–5.11)

## 2012-07-19 NOTE — Progress Notes (Signed)
Chief Complaint  Patient presents with  . Abdominal Pain    sharpe pain under ribs, wants bllood work    HPI:  Acute visit for:  1)Abd Pain: -RUQ intermittent pain -started 2 years ago after having a baby, had HELLP syndrome during pregnancy - reports was fine after -occ spasm in RUQ - last for a few seconds on and off for a day about once every 3 months -does not notice that this is after meals or activity - -had alcohol (<1 drink) a few days ago and felt nausea for a day after -denies: fevers, chills, weight loss, nausea vomiting, diarrhea, constipation, melena or hematochezia, acid reflux   Of note, I am listed as PCP but pt has never seen me before. Prior PCP Dr. Kirtland Bouchard. Has not had visit to establish with me.  ROS: See pertinent positives and negatives per HPI.  No past medical history on file.  No family history on file.  History   Social History  . Marital Status: Married    Spouse Name: N/A    Number of Children: N/A  . Years of Education: N/A   Social History Main Topics  . Smoking status: Never Smoker   . Smokeless tobacco: Never Used  . Alcohol Use: None  . Drug Use: None  . Sexually Active: None   Other Topics Concern  . None   Social History Narrative  . None    Current outpatient prescriptions:clobetasol ointment (TEMOVATE) 0.05 %, Apply 1 application topically as needed., Disp: , Rfl:   EXAM:  Filed Vitals:   07/19/12 1328  BP: 104/70  Temp: 98.4 F (36.9 C)    There is no height on file to calculate BMI.  GENERAL: vitals reviewed and listed above, alert, oriented, appears well hydrated and in no acute distress  HEENT: atraumatic, conjunttiva clear, no obvious abnormalities on inspection of external nose and ears  NECK: no obvious masses on inspection  LUNGS: clear to auscultation bilaterally, no wheezes, rales or rhonchi, good air movement  CV: HRRR, no peripheral edema  ABD: BS+, soft, NTTP  MS: moves all extremities without  noticeable abnormality  PSYCH: pleasant and cooperative, no obvious depression or anxiety  ASSESSMENT AND PLAN:  Discussed the following assessment and plan:  Abdominal pain, RUQ - Plan: CMP, US Abdomen Limited RUQ, CBC with Differential -discussed potential etiologies - rare symptoms and no findings on exam -will start with labs and RUQ [place] -advised to schedule NPV/follow up with me. -Patient advised to return or notify a doctor immediately if symptoms worsen or persist or new concerns arise.  Patient Instructions  -We have ordered labs or studies at this visit. It can take up to 1-2 weeks for results and processing. We will contact you with instructions IF your results are abnormal. Normal results will be released to your [facility]. If you have not heard from [place] or can not find your results in [facility] in 2 weeks please contact our office.   -follow up for New Patient visit and follow up in 1 month         Charice Zuno R.

## 2012-07-19 NOTE — Patient Instructions (Signed)
-  We have ordered labs or studies at this visit. It can take up to 1-2 weeks for results and processing. We will contact you with instructions IF your results are abnormal. Normal results will be released to your [facility]. If you have not heard from [place] or can not find your results in [facility] in 2 weeks please contact our office.   -follow up for New Patient visit and follow up in 1 month

## 2012-07-20 NOTE — Progress Notes (Signed)
Quick Note:  Called and left a detailed message for pt at designated cell phone number. ______ 

## 2012-07-23 ENCOUNTER — Ambulatory Visit
Admission: RE | Admit: 2012-07-23 | Discharge: 2012-07-23 | Disposition: A | Discharge status: Home / Self Care | Payer: BC Managed Care – PPO | Source: Ambulatory Visit | Attending: Family Medicine | Admitting: Family Medicine

## 2012-07-23 DIAGNOSIS — R1011 Right upper quadrant pain: Secondary | ICD-10-CM

## 2012-07-24 NOTE — Progress Notes (Signed)
Quick Note:  Left a detailed message for pt at designated cell phone number. ______ 

## 2012-08-19 ENCOUNTER — Ambulatory Visit: Payer: BC Managed Care – PPO | Admitting: Family Medicine

## 2012-08-24 ENCOUNTER — Ambulatory Visit: Payer: BC Managed Care – PPO | Admitting: Family Medicine

## 2012-10-15 LAB — OB RESULTS CONSOLE RPR: RPR: NONREACTIVE

## 2012-10-15 LAB — OB RESULTS CONSOLE HEPATITIS B SURFACE ANTIGEN: HEP B S AG: NEGATIVE

## 2012-10-15 LAB — OB RESULTS CONSOLE HIV ANTIBODY (ROUTINE TESTING): HIV: NONREACTIVE

## 2012-10-15 LAB — OB RESULTS CONSOLE RUBELLA ANTIBODY, IGM: Rubella: IMMUNE

## 2012-12-31 ENCOUNTER — Inpatient Hospital Stay (HOSPITAL_COMMUNITY)
Admission: AD | Admit: 2012-12-31 | Discharge: 2012-12-31 | Disposition: A | Discharge status: Home / Self Care | Payer: BC Managed Care – PPO | Source: Ambulatory Visit | Attending: Obstetrics and Gynecology | Admitting: Obstetrics and Gynecology

## 2012-12-31 ENCOUNTER — Encounter (HOSPITAL_COMMUNITY): Payer: Self-pay | Admitting: *Deleted

## 2012-12-31 DIAGNOSIS — Y929 Unspecified place or not applicable: Secondary | ICD-10-CM | POA: Insufficient documentation

## 2012-12-31 DIAGNOSIS — O99891 Other specified diseases and conditions complicating pregnancy: Secondary | ICD-10-CM | POA: Insufficient documentation

## 2012-12-31 HISTORY — DX: Coagulation defect, unspecified: D68.9

## 2012-12-31 HISTORY — DX: Lichen sclerosus et atrophicus: L90.0

## 2012-12-31 LAB — URINALYSIS, ROUTINE W REFLEX MICROSCOPIC
Bilirubin Urine: NEGATIVE
Hgb urine dipstick: NEGATIVE
Ketones, ur: NEGATIVE mg/dL
Nitrite: NEGATIVE
Specific Gravity, Urine: >1.03 — ABNORMAL HIGH (ref 1.005–1.030)
Urobilinogen, UA: 0.2 mg/dL (ref 0.0–1.0)
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
## 2013-04-08 ENCOUNTER — Other Ambulatory Visit: Payer: Self-pay | Admitting: Obstetrics and Gynecology

## 2013-04-21 ENCOUNTER — Encounter (HOSPITAL_COMMUNITY)
Admission: RE | Admit: 2013-04-21 | Discharge: 2013-04-21 | Disposition: A | Discharge status: Home / Self Care | Payer: BC Managed Care – PPO | Source: Ambulatory Visit | Attending: Obstetrics and Gynecology | Admitting: Obstetrics and Gynecology

## 2013-04-21 ENCOUNTER — Encounter (HOSPITAL_COMMUNITY): Payer: Self-pay

## 2013-04-21 ENCOUNTER — Encounter (HOSPITAL_COMMUNITY): Payer: Self-pay | Admitting: Pharmacist

## 2013-04-21 LAB — CBC
HCT: 40.7 % (ref 36.0–46.0)
Hemoglobin: 13.3 g/dL (ref 12.0–15.0)
MCH: 29.7 pg (ref 26.0–34.0)
MCHC: 32.7 g/dL (ref 30.0–36.0)
MCV: 90.8 fL (ref 78.0–100.0)
Platelets: 154 10*3/uL (ref 150–400)
RBC: 4.48 MIL/uL (ref 3.87–5.11)
RDW: 13.8 % (ref 11.5–15.5)
WBC: 8.9 10*3/uL (ref 4.0–10.5)

## 2013-04-21 LAB — PROTIME-INR
INR: 1.01 (ref 0.00–1.49)
PROTHROMBIN TIME: 13.1 s (ref 11.6–15.2)

## 2013-04-21 LAB — APTT: aPTT: 37 seconds (ref 24–37)

## 2013-04-21 LAB — RPR

## 2013-04-21 NOTE — Patient Instructions (Signed)
20 [name]  04/21/2013   Your procedure is scheduled on:  04/23/13  Enter through the Main Entrance of [facility] at 9 AM.  Pick up the phone at the desk and dial [phone].   Call this number if you have problems the morning of surgery: [phone]   Remember:   Do not eat food:After Midnight.  Do not drink clear liquids: After Midnight.  Take these medicines the morning of surgery with A SIP OF WATER: NA   Do not wear jewelry, make-up or nail polish.  Do not wear lotions, powders, or perfumes. You may wear deodorant.  Do not shave 24hours prior to surgery.  Do not bring valuables to the hospital.  [facility] is not   responsible for any belongings or valuables brought to the hospital.  Contacts, dentures or bridgework may not be worn into surgery.  Leave suitcase in the car. After surgery it may be brought to your room.  For patients admitted to the hospital, checkout time is 11:00 AM the day of              discharge.   Patients discharged the day of surgery will not be allowed to drive             home.  Name and phone number of your driver: NA  Special Instructions:      Please read over the following fact sheets that you were given:   Surgical Site Infection Prevention

## 2013-04-22 LAB — TYPE AND SCREEN
ABO/RH(D): O POS
Antibody Screen: NEGATIVE

## 2013-04-22 NOTE — H&P (Signed)
NAMValentina Brown:  Crombie, [name]            ACCOUNT NO.:  [id number]  MEDICAL RECORD NO.:  [id number]  LOCATION:  PERIO                         FACILITY:  [facility]  PHYSICIAN:  Lenoard Adenichard J. Neftaly Inzunza, M.D.DATE OF BIRTH:  30-Apr-1975  DATE OF ADMISSION:  01/22/2013 DATE OF DISCHARGE:                             HISTORY & PHYSICAL   CHIEF COMPLAINT:  Pregnancy with severe features.  HISTORY OF PRESENT ILLNESS:  A 38 year old white female G8P1 At 37 weeks who presents now with new-onset preeclampsia with severe features, elevated serum creatinine greater than 1.2, for a repeat C-section.  Medication, previously heparin,( now off x36 hours)  prenatal vitamins.  SOCIAL HISTORY:  Noncontributory.  FAMILY HISTORY:  Hypertension, breast cancer, depression, heart disease.  PREGNANCY HISTORY:  Six spontaneous pregnancy losses, 1 previous C- Section,HELLP syndrome.  ALLERGIES:  SHE HAS ALLERGIES TO CEPHALEXIN AND MACROBID.  SOCIAL HISTORY:  Nonsmoker, nondrinker.  She denies domestic or physical violence.  PHYSICAL EXAMINATION:  GENERAL:  She is a well-developed, well- nourished, white female, in no distress. HEENT:  Normal. NECK:  Supple.  Full range of motion. LUNGS:  Clear. HEART:  Regular rate and rhythm. ABDOMEN:  Soft, gravid, nontender. EXTREMITIES:  DTRs 2+ with clonus,   There are no cords.  Neg Homans sign.  IMPRESSION: 1. A 37-week intrauterine pregnancy. 2. Previous C-section. 3. Preeclampsia with severe features (Creatinine > 1.2).  She had a prior     cesarean section.  The risks of anesthesia,     infection, bleeding, injury to surrounding organs, possible need     for repair was discussed.  Delayed versus immediate complications     to include bowel and bladder injury noted.  The patient understood     and wishes to proceed.     Lenoard Adenichard J. Hollee Fate, M.D.     RJT/MEDQ  D:  04/22/2013  T:  04/22/2013  Job:  [id number]

## 2013-04-23 ENCOUNTER — Encounter (HOSPITAL_COMMUNITY): Payer: Self-pay | Admitting: Anesthesiology

## 2013-04-23 ENCOUNTER — Inpatient Hospital Stay (HOSPITAL_COMMUNITY)
Admission: AD | Admit: 2013-04-23 | Discharge: 2013-04-26 | DRG: 765 | Disposition: A | Discharge status: Home / Self Care | Payer: BC Managed Care – PPO | Source: Ambulatory Visit | Attending: Obstetrics and Gynecology | Admitting: Obstetrics and Gynecology

## 2013-04-23 ENCOUNTER — Encounter (HOSPITAL_COMMUNITY)
Admission: AD | Disposition: A | Discharge status: Home / Self Care | Payer: Self-pay | Source: Ambulatory Visit | Attending: Obstetrics and Gynecology

## 2013-04-23 ENCOUNTER — Encounter (HOSPITAL_COMMUNITY): Payer: BC Managed Care – PPO | Admitting: Anesthesiology

## 2013-04-23 ENCOUNTER — Inpatient Hospital Stay (HOSPITAL_COMMUNITY): Payer: BC Managed Care – PPO | Admitting: Anesthesiology

## 2013-04-23 DIAGNOSIS — O2620 Pregnancy care for patient with recurrent pregnancy loss, unspecified trimester: Secondary | ICD-10-CM | POA: Diagnosis present

## 2013-04-23 DIAGNOSIS — O09529 Supervision of elderly multigravida, unspecified trimester: Secondary | ICD-10-CM | POA: Diagnosis present

## 2013-04-23 DIAGNOSIS — O9912 Other diseases of the blood and blood-forming organs and certain disorders involving the immune mechanism complicating childbirth: Secondary | ICD-10-CM | POA: Diagnosis present

## 2013-04-23 DIAGNOSIS — O1414 Severe pre-eclampsia complicating childbirth: Secondary | ICD-10-CM | POA: Diagnosis present

## 2013-04-23 DIAGNOSIS — O1410 Severe pre-eclampsia, unspecified trimester: Secondary | ICD-10-CM | POA: Diagnosis present

## 2013-04-23 DIAGNOSIS — L940 Localized scleroderma [morphea]: Secondary | ICD-10-CM | POA: Diagnosis present

## 2013-04-23 DIAGNOSIS — O1490 Unspecified pre-eclampsia, unspecified trimester: Secondary | ICD-10-CM | POA: Diagnosis present

## 2013-04-23 DIAGNOSIS — Z98891 History of uterine scar from previous surgery: Secondary | ICD-10-CM

## 2013-04-23 DIAGNOSIS — O34219 Maternal care for unspecified type scar from previous cesarean delivery: Secondary | ICD-10-CM | POA: Diagnosis present

## 2013-04-23 DIAGNOSIS — D689 Coagulation defect, unspecified: Secondary | ICD-10-CM | POA: Diagnosis present

## 2013-04-23 HISTORY — DX: History of uterine scar from previous surgery: Z98.891

## 2013-04-23 LAB — COMPREHENSIVE METABOLIC PANEL
ALBUMIN: 2.4 g/dL — AB (ref 3.5–5.2)
ALK PHOS: 492 U/L — AB (ref 39–117)
ALT: 41 U/L — AB (ref 0–35)
AST: 57 U/L — ABNORMAL HIGH (ref 0–37)
BILIRUBIN TOTAL: 0.9 mg/dL (ref 0.3–1.2)
BUN: 31 mg/dL — AB (ref 6–23)
CO2: 16 mEq/L — ABNORMAL LOW (ref 19–32)
Calcium: 8.7 mg/dL (ref 8.4–10.5)
Chloride: 102 mEq/L (ref 96–112)
Creatinine, Ser: 1.44 mg/dL — ABNORMAL HIGH (ref 0.50–1.10)
GFR calc Af Amer: 53 mL/min — ABNORMAL LOW (ref >90)
GFR calc non Af Amer: 46 mL/min — ABNORMAL LOW (ref >90)
Glucose, Bld: 72 mg/dL (ref 70–99)
POTASSIUM: 4.5 meq/L (ref 3.7–5.3)
Sodium: 134 mEq/L — ABNORMAL LOW (ref 137–147)
Total Protein: 6.4 g/dL (ref 6.0–8.3)

## 2013-04-23 LAB — CBC
HCT: 38.6 % (ref 36.0–46.0)
Hemoglobin: 12.7 g/dL (ref 12.0–15.0)
MCH: 29.8 pg (ref 26.0–34.0)
MCHC: 32.9 g/dL (ref 30.0–36.0)
MCV: 90.6 fL (ref 78.0–100.0)
PLATELETS: 155 10*3/uL (ref 150–400)
RBC: 4.26 MIL/uL (ref 3.87–5.11)
RDW: 13.9 % (ref 11.5–15.5)
WBC: 8.9 10*3/uL (ref 4.0–10.5)

## 2013-04-23 SURGERY — Surgical Case
Anesthesia: Spinal | Site: Abdomen

## 2013-04-23 MED ORDER — DIBUCAINE 1 % RE OINT: 1.0000 "application " | TOPICAL_OINTMENT | RECTAL | Status: DC | PRN

## 2013-04-23 MED ORDER — SODIUM CHLORIDE 0.9 % IJ SOLN
INTRAMUSCULAR | Status: AC
  Filled 2013-04-23: qty 20

## 2013-04-23 MED ORDER — PHENYLEPHRINE HCL 10 MG/ML IJ SOLN
INTRAMUSCULAR | Status: AC
  Filled 2013-04-23: qty 1

## 2013-04-23 MED ORDER — SODIUM CHLORIDE 0.9 % IV SOLN
3.0000 g | INTRAVENOUS | Status: AC
  Administered 2013-04-23: 3 g via INTRAVENOUS
  Filled 2013-04-23: qty 3

## 2013-04-23 MED ORDER — SIMETHICONE 80 MG PO CHEW
80.0000 mg | CHEWABLE_TABLET | ORAL | Status: DC | PRN
  Filled 2013-04-23: qty 1

## 2013-04-23 MED ORDER — MORPHINE SULFATE (PF) 0.5 MG/ML IJ SOLN
INTRAMUSCULAR | Status: DC | PRN
  Administered 2013-04-23: .15 mg via INTRATHECAL

## 2013-04-23 MED ORDER — DIPHENHYDRAMINE HCL 50 MG/ML IJ SOLN
12.5000 mg | INTRAMUSCULAR | Status: DC | PRN
  Administered 2013-04-23: 12.5 mg via INTRAVENOUS

## 2013-04-23 MED ORDER — LACTATED RINGERS IV SOLN
INTRAVENOUS | Status: DC
  Administered 2013-04-23: 11:00:00 via INTRAVENOUS
  Administered 2013-04-23: 125 mL/h via INTRAVENOUS
  Administered 2013-04-23: 11:00:00 via INTRAVENOUS

## 2013-04-23 MED ORDER — SCOPOLAMINE 1 MG/3DAYS TD PT72
MEDICATED_PATCH | TRANSDERMAL | Status: AC
  Filled 2013-04-23: qty 1

## 2013-04-23 MED ORDER — SCOPOLAMINE 1 MG/3DAYS TD PT72
1.0000 | MEDICATED_PATCH | Freq: Once | TRANSDERMAL | Status: DC
  Administered 2013-04-23: 1.5 mg via TRANSDERMAL

## 2013-04-23 MED ORDER — BUPIVACAINE HCL (PF) 0.25 % IJ SOLN
INTRAMUSCULAR | Status: DC | PRN
Start: 2013-04-23 — End: 2013-04-23
  Administered 2013-04-23 (×2): 10 mL

## 2013-04-23 MED ORDER — SCOPOLAMINE 1 MG/3DAYS TD PT72: 1.0000 | MEDICATED_PATCH | Freq: Once | TRANSDERMAL | Status: DC

## 2013-04-23 MED ORDER — PHENYLEPHRINE 8 MG IN D5W 100 ML (0.08MG/ML) PREMIX OPTIME
INJECTION | INTRAVENOUS | Status: DC | PRN
  Administered 2013-04-23: 60 ug/min via INTRAVENOUS

## 2013-04-23 MED ORDER — MEPERIDINE HCL 25 MG/ML IJ SOLN: 6.2500 mg | INTRAMUSCULAR | Status: DC | PRN

## 2013-04-23 MED ORDER — DIPHENHYDRAMINE HCL 25 MG PO CAPS: 25.0000 mg | ORAL_CAPSULE | Freq: Four times a day (QID) | ORAL | Status: DC | PRN

## 2013-04-23 MED ORDER — ZOLPIDEM TARTRATE 5 MG PO TABS: 5.0000 mg | ORAL_TABLET | Freq: Every evening | ORAL | Status: DC | PRN

## 2013-04-23 MED ORDER — NALBUPHINE HCL 10 MG/ML IJ SOLN: 5.0000 mg | INTRAMUSCULAR | Status: DC | PRN

## 2013-04-23 MED ORDER — FENTANYL CITRATE 0.05 MG/ML IJ SOLN
INTRAMUSCULAR | Status: DC | PRN
  Administered 2013-04-23: 25 ug via INTRATHECAL

## 2013-04-23 MED ORDER — KETOROLAC TROMETHAMINE 30 MG/ML IJ SOLN: 30.0000 mg | Freq: Four times a day (QID) | INTRAMUSCULAR | Status: AC | PRN

## 2013-04-23 MED ORDER — NALOXONE HCL 1 MG/ML IJ SOLN
1.0000 ug/kg/h | INTRAVENOUS | Status: DC | PRN
  Filled 2013-04-23: qty 2

## 2013-04-23 MED ORDER — PHENYLEPHRINE 40 MCG/ML (10ML) SYRINGE FOR IV PUSH (FOR BLOOD PRESSURE SUPPORT)
PREFILLED_SYRINGE | INTRAVENOUS | Status: AC
  Filled 2013-04-23: qty 5

## 2013-04-23 MED ORDER — DIPHENHYDRAMINE HCL 50 MG/ML IJ SOLN: 25.0000 mg | INTRAMUSCULAR | Status: DC | PRN

## 2013-04-23 MED ORDER — PRENATAL MULTIVITAMIN CH
1.0000 | ORAL_TABLET | Freq: Every day | ORAL | Status: DC
  Administered 2013-04-24 – 2013-04-25 (×2): 1 via ORAL
  Filled 2013-04-23 (×2): qty 1

## 2013-04-23 MED ORDER — ONDANSETRON HCL 4 MG PO TABS: 4.0000 mg | ORAL_TABLET | ORAL | Status: DC | PRN

## 2013-04-23 MED ORDER — SODIUM CHLORIDE 0.9 % IJ SOLN: 3.0000 mL | INTRAMUSCULAR | Status: DC | PRN

## 2013-04-23 MED ORDER — ONDANSETRON HCL 4 MG/2ML IJ SOLN: 4.0000 mg | Freq: Three times a day (TID) | INTRAMUSCULAR | Status: DC | PRN

## 2013-04-23 MED ORDER — MAGNESIUM SULFATE 40 G IN LACTATED RINGERS - SIMPLE
1.0000 g/h | INTRAVENOUS | Status: AC
  Administered 2013-04-23: 1 g/h via INTRAVENOUS
  Filled 2013-04-23: qty 500

## 2013-04-23 MED ORDER — BUPIVACAINE HCL (PF) 0.25 % IJ SOLN
INTRAMUSCULAR | Status: AC
  Filled 2013-04-23: qty 30

## 2013-04-23 MED ORDER — ONDANSETRON HCL 4 MG/2ML IJ SOLN
INTRAMUSCULAR | Status: DC | PRN
  Administered 2013-04-23: 4 mg via INTRAVENOUS

## 2013-04-23 MED ORDER — OXYTOCIN 40 UNITS IN LACTATED RINGERS INFUSION - SIMPLE MED
INTRAVENOUS | Status: DC | PRN
  Administered 2013-04-23: 40 [IU] via INTRAVENOUS

## 2013-04-23 MED ORDER — METHYLERGONOVINE MALEATE 0.2 MG PO TABS: 0.2000 mg | ORAL_TABLET | ORAL | Status: DC | PRN

## 2013-04-23 MED ORDER — SIMETHICONE 80 MG PO CHEW
80.0000 mg | CHEWABLE_TABLET | ORAL | Status: DC
  Administered 2013-04-23 – 2013-04-26 (×3): 80 mg via ORAL
  Filled 2013-04-23: qty 1

## 2013-04-23 MED ORDER — 0.9 % SODIUM CHLORIDE (POUR BTL) OPTIME
TOPICAL | Status: DC | PRN
  Administered 2013-04-23: 1000 mL

## 2013-04-23 MED ORDER — TETANUS-DIPHTH-ACELL PERTUSSIS 5-2.5-18.5 LF-MCG/0.5 IM SUSP
0.5000 mL | Freq: Once | INTRAMUSCULAR | Status: DC
  Filled 2013-04-23: qty 0.5

## 2013-04-23 MED ORDER — DIPHENHYDRAMINE HCL 50 MG/ML IJ SOLN
INTRAMUSCULAR | Status: AC
  Administered 2013-04-23: 12.5 mg via INTRAVENOUS
  Filled 2013-04-23: qty 1

## 2013-04-23 MED ORDER — DIPHENHYDRAMINE HCL 25 MG PO CAPS: 25.0000 mg | ORAL_CAPSULE | ORAL | Status: DC | PRN

## 2013-04-23 MED ORDER — MAGNESIUM SULFATE BOLUS VIA INFUSION
2.0000 g | Freq: Once | INTRAVENOUS | Status: AC
  Administered 2013-04-23: 2 g via INTRAVENOUS
  Filled 2013-04-23: qty 500

## 2013-04-23 MED ORDER — LANOLIN HYDROUS EX OINT: 1.0000 "application " | TOPICAL_OINTMENT | CUTANEOUS | Status: DC | PRN

## 2013-04-23 MED ORDER — MEPERIDINE HCL 25 MG/ML IJ SOLN
INTRAMUSCULAR | Status: AC
  Filled 2013-04-23: qty 1

## 2013-04-23 MED ORDER — METHYLERGONOVINE MALEATE 0.2 MG/ML IJ SOLN: 0.2000 mg | INTRAMUSCULAR | Status: DC | PRN

## 2013-04-23 MED ORDER — OXYTOCIN 40 UNITS IN LACTATED RINGERS INFUSION - SIMPLE MED
62.5000 mL/h | INTRAVENOUS | Status: AC
Start: 2013-04-23 — End: 2013-04-24

## 2013-04-23 MED ORDER — LACTATED RINGERS IV SOLN
INTRAVENOUS | Status: DC | PRN
  Administered 2013-04-23: 11:00:00 via INTRAVENOUS

## 2013-04-23 MED ORDER — SENNOSIDES-DOCUSATE SODIUM 8.6-50 MG PO TABS
2.0000 | ORAL_TABLET | ORAL | Status: DC
  Administered 2013-04-23 – 2013-04-26 (×3): 2 via ORAL
  Filled 2013-04-23 (×3): qty 2

## 2013-04-23 MED ORDER — SIMETHICONE 80 MG PO CHEW
80.0000 mg | CHEWABLE_TABLET | Freq: Three times a day (TID) | ORAL | Status: DC
  Administered 2013-04-23 – 2013-04-26 (×8): 80 mg via ORAL
  Filled 2013-04-23 (×8): qty 1

## 2013-04-23 MED ORDER — FENTANYL CITRATE 0.05 MG/ML IJ SOLN: 25.0000 ug | INTRAMUSCULAR | Status: DC | PRN

## 2013-04-23 MED ORDER — OXYCODONE-ACETAMINOPHEN 5-325 MG PO TABS
1.0000 | ORAL_TABLET | ORAL | Status: DC | PRN
  Administered 2013-04-24 – 2013-04-26 (×11): 1 via ORAL
  Filled 2013-04-23 (×11): qty 1

## 2013-04-23 MED ORDER — ONDANSETRON HCL 4 MG/2ML IJ SOLN
INTRAMUSCULAR | Status: AC
  Filled 2013-04-23: qty 2

## 2013-04-23 MED ORDER — MORPHINE SULFATE 0.5 MG/ML IJ SOLN
INTRAMUSCULAR | Status: AC
  Filled 2013-04-23: qty 10

## 2013-04-23 MED ORDER — FENTANYL CITRATE 0.05 MG/ML IJ SOLN
INTRAMUSCULAR | Status: AC
Start: 2013-04-23 — End: 2013-04-23
  Filled 2013-04-23: qty 2

## 2013-04-23 MED ORDER — MENTHOL 3 MG MT LOZG: 1.0000 | LOZENGE | OROMUCOSAL | Status: DC | PRN

## 2013-04-23 MED ORDER — NALOXONE HCL 0.4 MG/ML IJ SOLN: 0.4000 mg | INTRAMUSCULAR | Status: DC | PRN

## 2013-04-23 MED ORDER — ONDANSETRON HCL 4 MG/2ML IJ SOLN: 4.0000 mg | INTRAMUSCULAR | Status: DC | PRN

## 2013-04-23 MED ORDER — WITCH HAZEL-GLYCERIN EX PADS: 1.0000 "application " | MEDICATED_PAD | CUTANEOUS | Status: DC | PRN

## 2013-04-23 MED ORDER — IBUPROFEN 600 MG PO TABS
600.0000 mg | ORAL_TABLET | Freq: Four times a day (QID) | ORAL | Status: DC
  Administered 2013-04-23 – 2013-04-24 (×3): 600 mg via ORAL
  Filled 2013-04-23 (×3): qty 1

## 2013-04-23 MED ORDER — LACTATED RINGERS IV SOLN
INTRAVENOUS | Status: AC
  Administered 2013-04-23 – 2013-04-24 (×2): via INTRAVENOUS

## 2013-04-23 MED ORDER — METOCLOPRAMIDE HCL 5 MG/ML IJ SOLN: 10.0000 mg | Freq: Three times a day (TID) | INTRAMUSCULAR | Status: DC | PRN

## 2013-04-23 MED ORDER — OXYTOCIN 10 UNIT/ML IJ SOLN
INTRAMUSCULAR | Status: AC
  Filled 2013-04-23: qty 4

## 2013-04-23 MED ORDER — MEPERIDINE HCL 25 MG/ML IJ SOLN
INTRAMUSCULAR | Status: DC | PRN
  Administered 2013-04-23 (×2): 12.5 mg via INTRAVENOUS

## 2013-04-23 SURGICAL SUPPLY — 36 items
ADH SKN CLS APL DERMABOND .7 (GAUZE/BANDAGES/DRESSINGS) ×1
CLAMP CORD UMBIL (MISCELLANEOUS) ×3 IMPLANT
CLOTH BEACON ORANGE TIMEOUT ST (SAFETY) ×3 IMPLANT
CONTAINER PREFILL 10% NBF 15ML (MISCELLANEOUS) IMPLANT
DERMABOND ADVANCED (GAUZE/BANDAGES/DRESSINGS) ×2
DERMABOND ADVANCED .7 DNX12 (GAUZE/BANDAGES/DRESSINGS) ×1 IMPLANT
DRAPE LG THREE QUARTER DISP (DRAPES) ×3 IMPLANT
DRSG OPSITE POSTOP 4X10 (GAUZE/BANDAGES/DRESSINGS) ×3 IMPLANT
DURAPREP 26ML APPLICATOR (WOUND CARE) ×3 IMPLANT
ELECT REM PT RETURN 9FT ADLT (ELECTROSURGICAL) ×3
ELECTRODE REM PT RTRN 9FT ADLT (ELECTROSURGICAL) ×1 IMPLANT
EXTRACTOR VACUUM M CUP 4 TUBE (SUCTIONS) IMPLANT
EXTRACTOR VACUUM M CUP 4' TUBE (SUCTIONS)
GLOVE BIO SURGEON STRL SZ7.5 (GLOVE) ×3 IMPLANT
GOWN STRL REUS W/TWL LRG LVL3 (GOWN DISPOSABLE) ×6 IMPLANT
KIT ABG SYR 3ML LUER SLIP (SYRINGE) IMPLANT
NEEDLE HYPO 25X1 1.5 SAFETY (NEEDLE) ×3 IMPLANT
NEEDLE HYPO 25X5/8 SAFETYGLIDE (NEEDLE) IMPLANT
NEEDLE SPNL 20GX3.5 QUINCKE YW (NEEDLE) IMPLANT
NS IRRIG 1000ML POUR BTL (IV SOLUTION) ×3 IMPLANT
PACK C SECTION WH (CUSTOM PROCEDURE TRAY) ×3 IMPLANT
STAPLER VISISTAT 35W (STAPLE) IMPLANT
SUT MNCRL 0 VIOLET CTX 36 (SUTURE) ×2 IMPLANT
SUT MNCRL AB 3-0 PS2 27 (SUTURE) ×3 IMPLANT
SUT MON AB 2-0 CT1 27 (SUTURE) ×3 IMPLANT
SUT MON AB-0 CT1 36 (SUTURE) ×3 IMPLANT
SUT MONOCRYL 0 CTX 36 (SUTURE) ×4
SUT PLAIN 0 NONE (SUTURE) IMPLANT
SUT PLAIN 2 0 (SUTURE)
SUT PLAIN 2 0 XLH (SUTURE) ×3 IMPLANT
SUT PLAIN ABS 2-0 CT1 27XMFL (SUTURE) IMPLANT
SYR 20CC LL (SYRINGE) IMPLANT
SYR CONTROL 10ML LL (SYRINGE) ×3 IMPLANT
TOWEL OR 17X24 6PK STRL BLUE (TOWEL DISPOSABLE) ×3 IMPLANT
TRAY FOLEY CATH 14FR (SET/KITS/TRAYS/PACK) ×3 IMPLANT
WATER STERILE IRR 1000ML POUR (IV SOLUTION) IMPLANT

## 2013-04-23 NOTE — Lactation Note (Signed)
This note was copied from the chart of Boy Valentina LucksJessica Ekstein. Lactation Consultation Note  Patient Name: Boy Valentina LucksJessica Nielsen ZOXWR'UToday's Date: 04/23/2013 Reason for consult: Follow-up assessment of this mom and baby, per report of Central Nursery RN, Cat, who states that this Mom [facility].  RN to inform Mom that if she changes her mind, she can still request LC follow-up if she desires.   Maternal Data    Feeding Feeding Type: Breast Fed Length of feed: 15 min (per mom)  LATCH Score/Interventions                      Lactation Tools Discussed/Used   N/A  Consult Status   (per request of patient Valentina Lucks(Crystalynn Dirk) Consult Status: Complete Date: 04/24/13 Follow-up type: Call as needed    Zara ChessJoanne P Chapman Matteucci 04/23/2013, 8:38 PM

## 2013-04-23 NOTE — Anesthesia Postprocedure Evaluation (Signed)
  Anesthesia Post-op Note  Patient: Mariah SalisburyJessica C Brown  Procedure(s) Performed: Procedure(s): Repeat Cesarean Section Delivery Baby Boy @ 1122, Apgars 9/9 (N/A)  Filed Vitals:   04/23/13 1230  BP: 109/81  Pulse: 71  Temp:   Resp: 20     Patient is awake, responsive, moving her legs, and has signs of resolution of her numbness. Pain and nausea are reasonably well controlled. Vital signs are stable and clinically acceptable. Oxygen saturation is clinically acceptable. There are no apparent anesthetic complications at this time. Patient is ready for discharge.

## 2013-04-23 NOTE — Progress Notes (Signed)
Patient ID: Mariah SalisburyJessica C Malson, female   DOB: March 30, 1975, 38 y.o.   MRN: [id number] Patient seen and examined. Consent witnessed and signed. No changes noted. Update completed.

## 2013-04-23 NOTE — Anesthesia Preprocedure Evaluation (Addendum)
Anesthesia Evaluation  Patient identified by MRN, date of birth, ID band Patient awake    Reviewed: Allergy & Precautions, H&P , Patient's Chart, lab work & pertinent test results  Airway Mallampati: II  TM Distance: >3 FB Neck ROM: full    Dental no notable dental hx.    Pulmonary  breath sounds clear to auscultation  Pulmonary exam normal       Cardiovascular Exercise Tolerance: Good Rhythm:regular Rate:Normal     Neuro/Psych    GI/Hepatic   Endo/Other    Renal/GU      Musculoskeletal   Abdominal   Peds  Hematology   Anesthesia Other Findings   Reproductive/Obstetrics                             Anesthesia Physical Anesthesia Plan  ASA: III  Anesthesia Plan: Spinal   Post-op Pain Management:    Induction:   Airway Management Planned:   Additional Equipment:   Intra-op Plan:   Post-operative Plan:   Informed Consent: I have reviewed the patients History and Physical, chart, labs and discussed the procedure including the risks, benefits and alternatives for the proposed anesthesia with the patient or authorized representative who has indicated his/her understanding and acceptance.   Dental Advisory Given  Plan Discussed with: CRNA  Anesthesia Plan Comments: (Lab work confirmed with CRNA in room. Platelets okay. Discussed spinal anesthetic, and patient consents to the procedure:  included risk of possible headache,backache, failed block, allergic reaction, and nerve injury. This patient was asked if she had any questions or concerns before the procedure started. )        Anesthesia Quick Evaluation  

## 2013-04-23 NOTE — Op Note (Signed)
Cesarean Section Procedure Note  Indications: previous uterine incision kerr x one and severe preeclampsia  Pre-operative Diagnosis: 37 week 2 day pregnancy.  Post-operative Diagnosis: same  Surgeon: Lenoard Adenichard J Jasim Harari   Assistants: Arita Missawson, CNM  Anesthesia: Local anesthesia 0.25.% bupivacaine and Spinal anesthesia  ASA Class: 2  Procedure Details  The patient was seen in the Holding Room. The risks, benefits, complications, treatment options, and expected outcomes were discussed with the patient.  The patient concurred with the proposed plan, giving informed consent. The risks of anesthesia, infection, bleeding and possible injury to other organs discussed. Injury to bowel, bladder, or ureter with possible need for repair discussed. Possible need for transfusion with secondary risks of hepatitis or HIV acquisition discussed. Post operative complications to include but not limited to DVT, PE and Pneumonia noted. The site of surgery properly noted/marked. The patient was taken to Operating Room # 2, identified as Mariah Brown and the procedure verified as C-Section Delivery. A Time Out was held and the above information confirmed.  After induction of anesthesia, the patient was draped and prepped in the usual sterile manner. A Pfannenstiel incision was made and carried down through the subcutaneous tissue to the fascia. Fascial incision was made and extended transversely using Mayo scissors. The fascia was separated from the underlying rectus tissue superiorly and inferiorly. The peritoneum was identified and entered. Peritoneal incision was extended longitudinally. The utero-vesical peritoneal reflection was incised transversely and the bladder flap was bluntly freed from the lower uterine segment. A low transverse uterine incision(Kerr hysterotomy) was made. Delivered from OT  presentation with vacuum assistance was a  female with Apgar scores of 8 at one minute and 9 at five minutes. Bulb  suctioning gently performed. Neonatal team in attendance.After the umbilical cord was clamped and cut cord blood was obtained for evaluation. The placenta was removed intact and appeared normal. The uterus was curetted with a dry lap pack. Good hemostasis was noted.The uterine outline, tubes and ovaries appeared normal. The uterine incision was closed with running locked sutures of 0 Monocryl x 2 layers. Hemostasis was observed. Lavage was carried out until clear.The parietal peritoneum was closed with a running 2-0 Monocryl suture. The fascia was then reapproximated with running sutures of 0 Monocryl. The skin was reapproximated with 3-0 monocryl  Instrument, sponge, and needle counts were correct prior the abdominal closure and at the conclusion of the case.   Findings: Omental adhesions to anterior abdominal wall  Estimated Blood Loss:  400         Drains: foley                 Specimens: placenta                 Complications:  None; patient tolerated the procedure well.         Disposition: PACU - hemodynamically stable.         Condition: stable  Attending Attestation: I performed the procedure.

## 2013-04-23 NOTE — Transfer of Care (Signed)
Immediate Anesthesia Transfer of Care Note  Patient: Mariah SalisburyJessica C Bernardy  Procedure(s) Performed: Procedure(s): Repeat Cesarean Section Delivery Baby Boy @ 1122, Apgars 9/9 (N/A)  Patient Location: PACU  Anesthesia Type:Spinal  Level of Consciousness: awake, alert  and oriented  Airway & Oxygen Therapy: Patient Spontanous Breathing  Post-op Assessment: Report given to PACU RN and Post -op Vital signs reviewed and stable  Post vital signs: Reviewed and stable  Complications: No apparent anesthesia complications

## 2013-04-23 NOTE — Addendum Note (Signed)
Addendum created 04/23/13 1437 by Cristela BlueKyle Markel Kurtenbach, MD   Modules edited: Orders, PRL Based Order Sets

## 2013-04-23 NOTE — Anesthesia Procedure Notes (Signed)
Spinal  Patient location during procedure: OR Preanesthetic Checklist Completed: patient identified, site marked, surgical consent, pre-op evaluation, timeout performed, IV checked, risks and benefits discussed and monitors and equipment checked Spinal Block Patient position: sitting Prep: DuraPrep Patient monitoring: heart rate, cardiac monitor, continuous pulse ox and blood pressure Approach: midline Location: L3-4 Injection technique: single-shot Needle Needle type: Sprotte  Needle gauge: 24 G Needle length: 9 cm Assessment Sensory level: T4 Additional Notes Spinal Dosage in OR  Bupivicaine ml       1.7 PFMS04   mcg        150 Fentanyl mcg            25    

## 2013-04-24 LAB — CBC
HEMATOCRIT: 36.1 % (ref 36.0–46.0)
Hemoglobin: 11.8 g/dL — ABNORMAL LOW (ref 12.0–15.0)
MCH: 29.9 pg (ref 26.0–34.0)
MCHC: 32.7 g/dL (ref 30.0–36.0)
MCV: 91.6 fL (ref 78.0–100.0)
PLATELETS: 144 10*3/uL — AB (ref 150–400)
RBC: 3.94 MIL/uL (ref 3.87–5.11)
RDW: 14.2 % (ref 11.5–15.5)
WBC: 10.4 10*3/uL (ref 4.0–10.5)

## 2013-04-24 LAB — COMPREHENSIVE METABOLIC PANEL
ALT: 34 U/L (ref 0–35)
AST: 54 U/L — ABNORMAL HIGH (ref 0–37)
Albumin: 1.9 g/dL — ABNORMAL LOW (ref 3.5–5.2)
Alkaline Phosphatase: 391 U/L — ABNORMAL HIGH (ref 39–117)
BILIRUBIN TOTAL: 0.7 mg/dL (ref 0.3–1.2)
BUN: 26 mg/dL — AB (ref 6–23)
CALCIUM: 7.9 mg/dL — AB (ref 8.4–10.5)
CO2: 23 mEq/L (ref 19–32)
CREATININE: 1.47 mg/dL — AB (ref 0.50–1.10)
Chloride: 104 mEq/L (ref 96–112)
GFR calc Af Amer: 52 mL/min — ABNORMAL LOW (ref >90)
GFR calc non Af Amer: 45 mL/min — ABNORMAL LOW (ref >90)
Glucose, Bld: 69 mg/dL — ABNORMAL LOW (ref 70–99)
Potassium: 5.1 mEq/L (ref 3.7–5.3)
Sodium: 136 mEq/L — ABNORMAL LOW (ref 137–147)
TOTAL PROTEIN: 5.5 g/dL — AB (ref 6.0–8.3)

## 2013-04-24 LAB — RPR

## 2013-04-24 MED ORDER — SODIUM CHLORIDE 0.9 % IJ SOLN: 3.0000 mL | INTRAMUSCULAR | Status: DC | PRN

## 2013-04-24 MED ORDER — SODIUM CHLORIDE 0.9 % IJ SOLN
3.0000 mL | Freq: Two times a day (BID) | INTRAMUSCULAR | Status: DC
  Administered 2013-04-24 (×2): 3 mL via INTRAVENOUS

## 2013-04-24 NOTE — Progress Notes (Signed)
Clinical Social Work Department PSYCHOSOCIAL ASSESSMENT - MATERNAL/CHILD 04/24/2013  Patient:  Mariah Brown,Mariah Brown  Account Number:  [id number]  Admit Date:  04/23/2013  Childs Name:   Rhett Azbill    Clinical Social Worker:  Micaella Gitto, LCSW   Date/Time:  04/24/2013 10:45 AM  Date Referred:  04/24/2013   Referral source  Central Nursery     Referred reason  Depression/Anxiety  Behavioral Health Issues   Other referral source:    I:  FAMILY / HOME ENVIRONMENT Child's legal guardian:  PARENT  Guardian - Name Guardian - Age Guardian - Address  Mariah Brown,Mariah Brown 37 [address]  [place], [place] [address]  Mariah Brown, Mariah Brown  same as above   Other household support members/support persons Other support:    II  PSYCHOSOCIAL DATA Information Source:    Financial and Community Resources Employment:   Both parents employed   Financial resources:  Private Insurance If Medicaid - County:    School / Grade:   Maternity Care Coordinator / Child Services Coordination / Early Interventions:  Cultural issues impacting care:    III  STRENGTHS Strengths  Supportive family/friends  Home prepared for Child (including basic supplies)  Adequate Resources   Strength comment:    IV  RISK FACTORS AND CURRENT PROBLEMS Current Problem:       V  SOCIAL WORK ASSESSMENT Acknowledged order for Social Work consult to assess mother's history of anxiety.  Parents are married and have 1 other dependent age 2.  Both parents are employed. Mother reports hx of anxiety with other pregnancy.  "It was a traumatic experience".  Informed that she has bouts of anxiety, but they're usually short lived and medication has never been required.    Discussed signs/symptoms of depression.  Mother notes that she may have experience PP Depression with prior pregnancy and if she experience those symptoms again she will seek help.    Mother reports no current symptoms of depression or anxiety.   Informed that  this delivery has been a very positive experience.  She denies any hx of substance abuse.  No acute social concerns noted at this time.   Informed parents of CSW availability.      VI SOCIAL WORK PLAN Social Work Plan  No Further Intervention Required / No Barriers to Discharge    

## 2013-04-24 NOTE — Addendum Note (Signed)
Addendum created 04/24/13 0819 by Shanon PayorSuzanne M Corrion Stirewalt, CRNA   Modules edited: Notes Section   Notes Section:  File: [id number]

## 2013-04-24 NOTE — Anesthesia Postprocedure Evaluation (Signed)
  Anesthesia Post-op Note  Patient: Mariah Brown  Procedure(s) Performed: Procedure(s): Repeat Cesarean Section Delivery Baby Boy @ 1122, Apgars 9/9 (N/A)  Patient Location: ICU  Anesthesia Type:Spinal  Level of Consciousness: awake, alert  and oriented  Airway and Oxygen Therapy: Patient Spontanous Breathing  Post-op Pain: none  Post-op Assessment: Post-op Vital signs reviewed, Patient's Cardiovascular Status Stable, Respiratory Function Stable, No headache, No backache, No residual numbness and No residual motor weakness  Post-op Vital Signs: Reviewed and stable  Last Vitals:  Filed Vitals:   04/24/13 0803  BP:   Pulse: 75  Temp:   Resp:     Complications: No apparent anesthesia complications

## 2013-04-24 NOTE — Progress Notes (Signed)
Patient ID: Mariah SalisburyJessica C Brown, female   DOB: Aug 09, 1975, 38 y.o.   MRN: [id number] Subjective: POD# 1 Information for the patient's newborn:  Mariah Brown, Boy Mariah Brown [[id number]][[id number]]  female  / circ planning   Reports feeling well Feeding: breast Patient reports tolerating PO.  Breast symptoms: none Pain controlled with ibuprofen (OTC) and narcotic analgesics including Percocet Denies HA/SOB/C/P/N/V/dizziness. Flatus present. She reports vaginal bleeding as normal, without clots.  She is ambulating, urinating without difficult.     Objective:   VS:  Filed Vitals:   04/24/13 0400 04/24/13 0442 04/24/13 0500 04/24/13 0600  BP:  124/78 128/81 139/81  Pulse:  70 87 73  Temp: 98.4 F (36.9 C)     TempSrc: Oral     Resp:  16  18  Height:      Weight:      SpO2:  98% 97% 96%     Intake/Output Summary (Last 24 hours) at 04/24/13 0758 Last data filed at 04/24/13 0700  Gross per 24 hour  Intake 6731.4 ml  Output   5150 ml  Net 1581.4 ml        Recent Labs  04/23/13 0944 04/24/13 0520  WBC 8.9 10.4  HGB 12.7 11.8*  HCT 38.6 36.1  PLT 155 144*     Blood type: --/--/O POS (04/09 1203)  Rubella: Immune (10/03 [id number])     Physical Exam:  General: alert, cooperative and no distress CV: Regular rate and rhythm, S1S2 present or without murmur or extra heart sounds Resp: clear Abdomen: soft, nontender, normal bowel sounds Incision: Tegaderm and Honeycomb dressing intact - skin well-approximated with sutures Uterine Fundus: firm, U even, nontender Lochia: minimal Ext: extremities normal, atraumatic, trace edema, no cyanosis, and Homans sign is negative, no sign of DVT    Assessment/Plan: 38 y.o.   POD# 1.  s/p Cesarean Delivery.  Indications: Repeat Cesarean Delivery for severe PEC                Principal Problem:   Status post repeat low transverse cesarean section (4/11) Active Problems:   Preeclampsia  Doing well, stable.               Regular diet as tolerated  D/C  Magnesium Sulfate at 11 am D/C foley at 11am Transfer to postpartum 4 hours after Magnesium d/c'd Ambulate Routine post-op care  *Dr. Billy Brown notified of assessment and plan - agrees  Mariah Gowerolitta M Retal Tonkinson, MSN, CNM 04/24/2013, 7:58 AM

## 2013-04-24 NOTE — Progress Notes (Signed)
[id number] - Candie MileJennifer Brazel, RN updated

## 2013-04-24 NOTE — Lactation Note (Signed)
This note was copied from the chart of Mariah Brown Buchheit. Lactation Consultation Note  Patient Name: Mariah Brown Crisci ZOXWR'UToday's Date: 04/24/2013 Reason for consult: Follow-up assessment  Mom asked for LC to visit when [facility] inquired about need for visit from RN.  Mom stated infant received bottles of formula d/t low BS as ordered by MD.  Laverle PatterMom is experienced breastfeeder with previous child for 2.5 yrs.  Mom very knowledgeable about breastfeeding.  Infant has breastfed X10 (10-35 min) in past 24 hrs + attempts x2 (STS) + Formula x4 (1-10 ml); voids -7 in past 24 hrs/ 8 life; stools - 6 in 24 hrs/ 6 life.  Infant is 6428 hrs old at time of visit. Infant was circumcised 2 hrs prior to visit but mom was wanting to feed infant.  Mom is wearing breast shell on left breast to help left nipple erect more because "he does not like this side as good as the other;" mom request shell from RN.  LC gave minimal assistance with latching infant on left breast in cross-cradle hold and taught mom to use asymetrical latching technique and assuring flanging of bottom lip.  Swallows heard with compressions but infant was very sleepy and only stayed on for 5 minutes; LS-8.  Day of Life Guidelines given to mom for supplementation as needed; mom very appreciative of guideline.  Increased hand pump flange to #27 as requested by mom and LC checked for appropriate fit.  Mom wants RN on MBU to set up with DEBP to use EBM for supplementation as needed.  Encouraged mom to use hand-on pumping and hand expression at end of pumping session.  Hand expression reviewed with return demonstration and observation of colostrum.  Mom appreciative of LC assistance and optimistic about milk production.  Encouraged to call for assistance as needed.    Feeding Feeding Type: Breast Milk Length of feed: 30 min  LATCH Score/Interventions Latch: Grasps breast easily, tongue down, lips flanged, rhythmical sucking. (circumcised 2 hrs ago, infant sleepy  but latched) Intervention(s): Skin to skin;Waking techniques Intervention(s): Breast compression  Audible Swallowing: A few with stimulation Intervention(s): Hand expression  Type of Nipple: Everted at rest and after stimulation  Comfort (Breast/Nipple): Soft / non-tender     Hold (Positioning): Assistance needed to correctly position infant at breast and maintain latch. (minimal assistance) Intervention(s): Breastfeeding basics reviewed;Support Pillows;Position options;Skin to skin  LATCH Score: 8  Lactation Tools Discussed/Used Tools: Shells;Pump (mom stated left nipple less everted than right and asked for RN to give shells) Shell Type: Inverted Breast pump type: Manual   Consult Status Consult Status: Follow-up Date: 04/25/13 Follow-up type: In-patient    Claiborne RiggStephanie Walker Treasa Bradshaw 04/24/2013, 5:38 PM

## 2013-04-25 ENCOUNTER — Encounter (HOSPITAL_COMMUNITY): Payer: Self-pay | Admitting: Obstetrics and Gynecology

## 2013-04-25 NOTE — Progress Notes (Signed)
POD # 2  Subjective: Pt reports feeling well/ Pain controlled with Motrin and Percocet No HA, visual disturbances, or epigastric pain Tolerating po/Voiding without problems/ No n/v/ Flatus present Activity: ad lib Bleeding is light Newborn info:  Information for the patient's newborn:  Cecilie LowersWilliams, Boy Gwynevere [[id number]][[id number]]  female Feeding: breast   Objective: VS:  Filed Vitals:   04/24/13 2046 04/25/13 0031 04/25/13 0434 04/25/13 0800  BP: 137/87 130/70 124/68 128/78  Pulse: 82 80 80 82  Temp: 98.2 F (36.8 C) 97.9 F (36.6 C) 97.5 F (36.4 C) 97.5 F (36.4 C)  TempSrc: Oral Oral Oral Oral  Resp: 18 20 18 18   Height:      Weight:      SpO2:    98%    I&O: Intake/Output     04/12 0701 - 04/13 0700 04/13 0701 - 04/14 0700   P.O. 2862    I.V. (mL/kg) 450 (4.3)    Total Intake(mL/kg) 3312 (31.7)    Urine (mL/kg/hr) 6700 (2.7)    Blood     Total Output 6700     Net -3388          Urine Occurrence 3 x      LABS:  Recent Labs  04/23/13 0944 04/24/13 0520  WBC 8.9 10.4  HGB 12.7 11.8*  HCT 38.6 36.1  PLT 155 144*    Blood type: --/--/O POS (04/09 1203) Rubella: Immune (10/03 [id number])     Physical Exam:  General: alert and cooperative CV: Regular rate and rhythm Resp: CTA bilaterally Abdomen: soft, nontender, normal bowel sounds Uterine Fundus: firm, below umbilicus, nontender Incision: Covered with Tegaderm and honeycomb dressing; no significant drainage; well approximated. Lochia: none Ext: edema 1+ BLE and Homans sign is negative, no sign of DVT    Assessment/: POD # 2/ [id number]/ S/P C/Section d/t PEC PEC, delivered-stable Doing well  Plan: Continue routine post op orders Anticipate discharge home tomorrow   Signed: Lawernce PittsMelanie N Isair Inabinet, MSN, CNM 04/25/2013, 9:26 AM

## 2013-04-25 NOTE — Lactation Note (Signed)
This note was copied from the chart of Mariah Valentina LucksJessica Meland. Lactation Consultation Note Follow up visit at 55 hours of age.  Mom reports good feedings, 8 recorded in last 24 hours with 4 stools and 1 void.  Mom continues to supplement with feedings.  Mom c/o nipple pain that started early with poor latch.  Mom has dry nipples and reports they were dry prior to delivery, maybe dried colostrum.  Encouraged mom to use EBM on nipples and gave comfort gels with instructions.  Baby asleep at this time.  Previous latch score by RN is 9.  Encouraged mom to call for assist as needed.    Patient Name: Mariah Brown JYNWG'NToday's Date: 04/25/2013 Reason for consult: Follow-up assessment   Maternal Data    Feeding    LATCH Score/Interventions             Problem noted: Mild/Moderate discomfort Interventions (Mild/moderate discomfort): Comfort gels;Hand expression;Pre-pump if needed        Lactation Tools Discussed/Used Tools: Comfort gels   Consult Status Consult Status: Follow-up Date: 04/26/13 Follow-up type: In-patient    Arvella MerlesJana Lynn [facility] 04/25/2013, 6:47 PM

## 2013-04-26 LAB — COMPREHENSIVE METABOLIC PANEL
ALK PHOS: 327 U/L — AB (ref 39–117)
ALT: 32 U/L (ref 0–35)
AST: 47 U/L — AB (ref 0–37)
Albumin: 2.2 g/dL — ABNORMAL LOW (ref 3.5–5.2)
BUN: 28 mg/dL — ABNORMAL HIGH (ref 6–23)
CALCIUM: 8.7 mg/dL (ref 8.4–10.5)
CO2: 24 meq/L (ref 19–32)
Chloride: 103 mEq/L (ref 96–112)
Creatinine, Ser: 1.29 mg/dL — ABNORMAL HIGH (ref 0.50–1.10)
GFR calc Af Amer: 61 mL/min — ABNORMAL LOW (ref >90)
GFR calc non Af Amer: 53 mL/min — ABNORMAL LOW (ref >90)
Glucose, Bld: 73 mg/dL (ref 70–99)
POTASSIUM: 5.1 meq/L (ref 3.7–5.3)
Sodium: 137 mEq/L (ref 137–147)
Total Bilirubin: 0.6 mg/dL (ref 0.3–1.2)
Total Protein: 6.4 g/dL (ref 6.0–8.3)

## 2013-04-26 MED ORDER — OXYCODONE-ACETAMINOPHEN 5-325 MG PO TABS: 1.0000 | ORAL_TABLET | ORAL | Status: DC | PRN

## 2013-04-26 MED ORDER — HYDROCHLOROTHIAZIDE 12.5 MG PO CAPS: 12.5000 mg | ORAL_CAPSULE | Freq: Every day | ORAL | Status: DC

## 2013-04-26 NOTE — Progress Notes (Signed)
Patient ID: Mariah SalisburyJessica C Brown, female   DOB: 1975-07-14, 38 y.o.   MRN: [id number]  POSTOPERATIVE DAY # 3 S/P repeat CS   S:         Reports feeling well             Tolerating po intake / no nausea / no vomiting / + flatus / no BM             Bleeding is spotting             Pain controlled with percocet             Up ad lib / ambulatory/ voiding QS  Newborn breast feeding    O:  VS: BP 139/89  Pulse 76  Temp(Src) 97.4 F (36.3 C) (Oral)  Resp 18  Ht 5\' 7"  (1.702 m)  Wt 104.373 kg (230 lb 1.6 oz)  BMI 36.03 kg/m2  SpO2 98%  LMP 07/12/2012  Breastfeeding? Unknown   LABS:               Recent Labs  04/23/13 0944 04/24/13 0520  WBC 8.9 10.4  HGB 12.7 11.8*  PLT 155 144*               Bloodtype: --/--/O POS (04/09 1203)  Rubella: Immune (10/03 [id number])                                Physical Exam:             Alert and Oriented X3  Lungs: Clear and unlabored  Heart: regular rate and rhythm / no mumurs  Abdomen: soft, non-tender, non-distended active BS             Fundus: firm, non-tender, U-1             Dressing intact honeycomb dressing              Incision:  approximated with sutures / no erythema / no ecchymosis / no drainage  Perineum: intact  Lochia: light  Extremities: 1+ pedal edema R> L (dangling right leg off bed often)                                   no calf pain or tenderness, negative Homans  A:        POD # 3 S/P CS             P:        Routine postoperative care              DC home    Mariah Brown CNM, MSN, [facility] 04/26/2013, 9:07 AM

## 2013-04-26 NOTE — Discharge Summary (Signed)
POSTOPERATIVE DISCHARGE SUMMARY:  Patient ID: Mariah SalisburyJessica C Blaney MRN: [id number] DOB/AGE: July 26, 1975 38 y.o.  Admit date: 04/23/2013 Admission Diagnoses: 37 weeks / preeclampsia - severe features / previous CS / hx recurrent pregnancy loss / hx HELLP last pregnancy  Discharge date:  04/26/2013 Discharge Diagnoses: POD 3 s/p repeat CS / severe preeclampsia -delivered / dependent edema  Prenatal history: [id number]   EDC : 05/12/2013, Alternate EDD Entry  [facility]  Primary provider : Taavon Prenatal course complicated by recurrent pregnancy loss (6) / previous HELLP / previous CS  Prenatal Labs: ABO, Rh: --/--/O POS (04/09 1203) Antibody: NEG (04/09 1203) Rubella: Immune (10/03 0937)   RPR: NON REAC (04/12 0520)  HBsAg: Negative (10/03 [id number])  HIV: Non-reactive (10/03 0937)  GTT :NL  Medical / Surgical History :  Past medical history:  Past Medical History  Diagnosis Date  . Clotting disorder     PAI-1; on heparin  . Lichen sclerosus   . Status post repeat low transverse cesarean section (4/11) 04/23/2013    Past surgical history:  Past Surgical History  Procedure Laterality Date  . Cesarean section    . Cesarean section N/A 04/23/2013    Procedure: Repeat Cesarean Section Delivery Baby Boy @ 1122, Apgars 9/9;  Surgeon: Lenoard Adenichard J Taavon, MD;  Location: [facility];  Service: Obstetrics;  Laterality: N/A;    Family History:  Family History  Problem Relation Age of Onset  . Cancer Mother     colon  . Hypertension Father     Social History:  reports that she has never smoked. She has never used smokeless tobacco. She reports that she does not drink alcohol or use illicit drugs.  Allergies: Keflex and Macrobid   Current Medications at time of admission:  Prior to Admission medications   Medication Sig Start Date End Date Taking? Authorizing Provider  Prenatal Vit-Fe Fumarate-FA (PRENATAL MULTIVITAMIN) TABS tablet Take 1 tablet by mouth at  bedtime.   Yes Historical Provider, MD  hydrochlorothiazide (MICROZIDE) 12.5 MG capsule Take 1 capsule (12.5 mg total) by mouth daily. 04/26/13   Marlinda Mikeanya Eliah Marquard, CNM  oxyCODONE-acetaminophen (PERCOCET/ROXICET) 5-325 MG per tablet Take 1-2 tablets by mouth every 4 (four) hours as needed for severe pain (moderate - severe pain). 04/26/13   Marlinda Mikeanya Ladeja Pelham, CNM   Procedures: Cesarean section delivery on 04/23/2013  with delivery of  female newborn by Dr Billy Coastaavon   See operative report for further details APGAR (1 MIN):  8 APGAR (5 MINS): 9    Postoperative / postpartum course:  Uncomplicated with discharge on POD 3  Discharge Instructions:  Discharged Condition: stable  Activity: pelvic rest and postoperative restrictions x 2   Diet: routine  Medications:    Medication List    STOP taking these medications       heparin [id number] UNIT/ML injection      TAKE these medications       hydrochlorothiazide 12.5 MG capsule  Commonly known as:  MICROZIDE  Take 1 capsule (12.5 mg total) by mouth daily.     oxyCODONE-acetaminophen 5-325 MG per tablet  Commonly known as:  PERCOCET/ROXICET  Take 1-2 tablets by mouth every 4 (four) hours as needed for severe pain (moderate - severe pain).     prenatal multivitamin Tabs tablet  Take 1 tablet by mouth at bedtime.        Wound Care: keep clean and dry / remove honeycomb POD 5 Postpartum Instructions: [facility] discharge booklet - instructions reviewed  Discharge to: Home  Follow up :  [facility] in 1 week for interval visit with Marlinda Mikeanya Jaelyn Cloninger for BP recheck and edema reassess [facility] in 6 weeks for routine postpartum visit with Taavon                Signed: Marlinda Mikeanya Niamh Rada CNM, MSN, [facility] 04/26/2013, 9:09 AM

## 2013-05-06 ENCOUNTER — Other Ambulatory Visit (HOSPITAL_COMMUNITY): Payer: BC Managed Care – PPO

## 2013-05-11 ENCOUNTER — Encounter (HOSPITAL_COMMUNITY): Payer: Self-pay | Admitting: *Deleted

## 2013-11-14 ENCOUNTER — Encounter (HOSPITAL_COMMUNITY): Payer: Self-pay | Admitting: *Deleted

## 2015-04-05 ENCOUNTER — Telehealth: Payer: Self-pay | Admitting: Family Medicine

## 2015-04-05 NOTE — Telephone Encounter (Signed)
Relation to ZO:XWRUpt:self Call back number:[phone]   Reason for call:  Patient states she use to go to church with you and she's Facebook friends with you and would like to establish you as her PCP. Please advise

## 2015-04-05 NOTE — Telephone Encounter (Signed)
Yes,  Its ok-- ive talked to her before

## 2015-04-06 NOTE — Telephone Encounter (Signed)
Patient scheduled for 04/23/15 at 11:30am patient experiencing depression. Patient started a new job therefore 04/23/15 would be the soonest she would be available to come in.

## 2015-04-23 ENCOUNTER — Encounter: Payer: Self-pay | Admitting: Family Medicine

## 2015-04-23 ENCOUNTER — Ambulatory Visit (INDEPENDENT_AMBULATORY_CARE_PROVIDER_SITE_OTHER): Payer: BC Managed Care – PPO | Admitting: Family Medicine

## 2015-04-23 VITALS — BP 120/82 | HR 83 | Temp 98.4°F | Ht 67.0 in | Wt 232.0 lb

## 2015-04-23 DIAGNOSIS — F329 Major depressive disorder, single episode, unspecified: Secondary | ICD-10-CM | POA: Diagnosis not present

## 2015-04-23 DIAGNOSIS — F32A Depression, unspecified: Secondary | ICD-10-CM

## 2015-04-23 MED ORDER — SERTRALINE HCL 50 MG PO TABS: ORAL_TABLET | ORAL | Status: DC

## 2015-04-23 NOTE — Patient Instructions (Signed)
Major Depressive Disorder Major depressive disorder is a mental illness. It also may be called clinical depression or unipolar depression. Major depressive disorder usually causes feelings of sadness, hopelessness, or helplessness. Some people with this disorder do not feel particularly sad but lose interest in doing things they used to enjoy (anhedonia). Major depressive disorder also can cause physical symptoms. It can interfere with work, school, relationships, and other normal everyday activities. The disorder varies in severity but is longer lasting and more serious than the sadness we all feel from time to time in our lives. Major depressive disorder often is triggered by stressful life events or major life changes. Examples of these triggers include divorce, loss of your job or home, a move, and the death of a family member or close friend. Sometimes this disorder occurs for no obvious reason at all. People who have family members with major depressive disorder or bipolar disorder are at higher risk for developing this disorder, with or without life stressors. Major depressive disorder can occur at any age. It may occur just once in your life (single episode major depressive disorder). It may occur multiple times (recurrent major depressive disorder). SYMPTOMS People with major depressive disorder have either anhedonia or depressed mood on nearly a daily basis for at least 2 weeks or longer. Symptoms of depressed mood include:  Feelings of sadness (blue or down in the dumps) or emptiness.  Feelings of hopelessness or helplessness.  Tearfulness or episodes of crying (may be observed by others).  Irritability (children and adolescents). In addition to depressed mood or anhedonia or both, people with this disorder have at least four of the following symptoms:  Difficulty sleeping or sleeping too much.   Significant change (increase or decrease) in appetite or weight.   Lack of energy or  motivation.  Feelings of guilt and worthlessness.   Difficulty concentrating, remembering, or making decisions.  Unusually slow movement (psychomotor retardation) or restlessness (as observed by others).   Recurrent wishes for death, recurrent thoughts of self-harm (suicide), or a suicide attempt. People with major depressive disorder commonly have persistent negative thoughts about themselves, other people, and the world. People with severe major depressive disorder may experiencedistorted beliefs or perceptions about the world (psychotic delusions). They also may see or hear things that are not real (psychotic hallucinations). DIAGNOSIS Major depressive disorder is diagnosed through an assessment by your health care provider. Your health care provider will ask aboutaspects of your daily life, such as mood,sleep, and appetite, to see if you have the diagnostic symptoms of major depressive disorder. Your health care provider may ask about your medical history and use of alcohol or drugs, including prescription medicines. Your health care provider also may do a physical exam and blood work. This is because certain medical conditions and the use of certain substances can cause major depressive disorder-like symptoms (secondary depression). Your health care provider also may refer you to a mental health specialist for further evaluation and treatment. TREATMENT It is important to recognize the symptoms of major depressive disorder and seek treatment. The following treatments can be prescribed for this disorder:   Medicine. Antidepressant medicines usually are prescribed. Antidepressant medicines are thought to correct chemical imbalances in the brain that are commonly associated with major depressive disorder. Other types of medicine may be added if the symptoms do not respond to antidepressant medicines alone or if psychotic delusions or hallucinations occur.  Talk therapy. Talk therapy can be  helpful in treating major depressive disorder by providing   support, education, and guidance. Certain types of talk therapy also can help with negative thinking (cognitive behavioral therapy) and with relationship issues that trigger this disorder (interpersonal therapy). A mental health specialist can help determine which treatment is best for you. Most people with major depressive disorder do well with a combination of medicine and talk therapy. Treatments involving electrical stimulation of the brain can be used in situations with extremely severe symptoms or when medicine and talk therapy do not work over time. These treatments include electroconvulsive therapy, transcranial magnetic stimulation, and vagal nerve stimulation.   This information is not intended to replace advice given to you by your health care provider. Make sure you discuss any questions you have with your health care provider.   Document Released: 04/26/2012 Document Revised: 01/20/2014 Document Reviewed: 04/26/2012 Elsevier Interactive Patient Education 2016 [employer]  
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
## 2015-04-23 NOTE — Progress Notes (Signed)
Pre visit review using our clinic review tool, if applicable. No additional management support is needed unless otherwise documented below in the visit note. 

## 2015-05-15 ENCOUNTER — Ambulatory Visit: Payer: BC Managed Care – PPO | Admitting: Family Medicine

## 2015-08-06 ENCOUNTER — Encounter: Payer: Self-pay | Admitting: Medical

## 2015-08-06 ENCOUNTER — Ambulatory Visit (INDEPENDENT_AMBULATORY_CARE_PROVIDER_SITE_OTHER): Payer: BC Managed Care – PPO | Admitting: Medical

## 2015-08-06 VITALS — BP 122/80 | HR 81 | Temp 98.1°F | Ht 67.0 in | Wt 231.8 lb

## 2015-08-06 DIAGNOSIS — L0390 Cellulitis, unspecified: Secondary | ICD-10-CM

## 2015-08-06 DIAGNOSIS — H00013 Hordeolum externum right eye, unspecified eyelid: Secondary | ICD-10-CM

## 2015-08-06 DIAGNOSIS — H00033 Abscess of eyelid right eye, unspecified eyelid: Secondary | ICD-10-CM

## 2015-08-06 MED ORDER — AMPICILLIN 500 MG PO CAPS: 500.0000 mg | ORAL_CAPSULE | Freq: Four times a day (QID) | ORAL | 0 refills | Status: DC

## 2015-08-06 NOTE — Progress Notes (Addendum)
Subjective:    Patient ID: Mariah Brown, female    DOB: 1975/09/14, 40 y.o.   MRN: [id number]  HPI   3 days ago felt slight swelling of her rt lower eye lid. The within lat 24 hours got worse. She states she tried to pull out some eye lashes to area and her lower eye lid/upper cheek area is swollen. She states her lymph nodes submandibular nodes. A possible preauricular node swollen as well on rt side as well.   No chills or sweats. Some fatigue.  Pt is breast feeding 40 year old.   Review of Systems  Constitutional: Positive for fatigue. Negative for chills and fever.  HENT:       Rt lower eye lid very swollen. Some tenderness to rt upper cheek. Submandibluar nodes enlarged and rt side preauricular node maybe swollen as wel.  Respiratory: Negative for cough, shortness of breath and wheezing.   Cardiovascular: Negative for chest pain and palpitations.  Neurological: Negative for dizziness and headaches.  Hematological: Positive for adenopathy. Does not bruise/bleed easily.  Psychiatric/Behavioral: Negative for behavioral problems and confusion.    Past Medical History:  Diagnosis Date  . Clotting disorder (HCC)    PAI-1; on heparin  . Depression   . Lichen sclerosus   . Status post repeat low transverse cesarean section (4/11) 04/23/2013     Social History   Social History  . Marital status: Married    Spouse name: N/A  . Number of children: N/A  . Years of education: N/A   Occupational History  . Not on file.   Social History Main Topics  . Smoking status: Never Smoker  . Smokeless tobacco: Never Used  . Alcohol use No  . Drug use: No  . Sexual activity: Yes   Other Topics Concern  . Not on file   Social History Narrative  . No narrative on file    Past Surgical History:  Procedure Laterality Date  . CESAREAN SECTION    . CESAREAN SECTION N/A 04/23/2013   Procedure: Repeat Cesarean Section Delivery Baby Boy @ 1122, Apgars 9/9;  Surgeon: Lenoard Aden, MD;  Location: [facility];  Service: Obstetrics;  Laterality: N/A;    Family History  Problem Relation Age of Onset  . Cancer Mother     colon  . Hypertension Father     Allergies  Allergen Reactions  . Keflex [Cephalexin] Hives    Pt can take PCN's  . Macrobid [Nitrofurantoin Macrocrystal] Hives    Current Outpatient Prescriptions on File Prior to Visit  Medication Sig Dispense Refill  . clobetasol ointment (TEMOVATE) 0.05 % Apply 1 application topically 2 (two) times daily as needed.    . sertraline (ZOLOFT) 50 MG tablet 1 po qd 30 tablet 3   No current facility-administered medications on file prior to visit.     BP 122/80 (BP Location: Right Arm, Patient Position: Sitting, Cuff Size: Normal)   Pulse 81   Temp 98.1 F (36.7 C) (Oral)   Ht  (1.702 m)   Wt 231 lb 12.8 oz (105.1 kg)   LMP 07/30/2015   SpO2 98%   BMI 36.31 kg/m       Objective:   Physical Exam   General  Mental Status - Alert. General Appearance - Well groomed. Not in acute distress.  Skin Rashes- No Rashes.  HEENT Head- Normal. Ear Auditory Canal - Left- Normal. Right - Normal.Tympanic Membrane- Left- Normal. Right- Normal. Eye Sclera/Conjunctiva- Left- Normal.  Right- faint conjunctival redness. Rt lower lide swollen with apparent stye. The area red swollen overall with appearance of abscess likely. Nose & Sinuses Nasal Mucosa- Left-  Boggy and Congested. Right-  No Boggy and  No  Congested.Rt side upper cheek/maxillary region faint tenderness(faint swollen of lower lid runs toward maxillary area) but no frontal sinus pressure. Mouth & Throat Lips: Upper Lip- Normal: no dryness, cracking, pallor, cyanosis, or vesicular eruption. Lower Lip-Normal: no dryness, cracking, pallor, cyanosis or vesicular eruption. Buccal Mucosa- Bilateral- No Aphthous ulcers. Oropharynx- No Discharge or Erythema. Tonsils: Characteristics- Bilateral- No Erythema or Congestion. Size/Enlargement- Bilateral-  No enlargement. Discharge- bilateral-None.  Neck Neck- Supple. No Masses.   Chest and Lung Exam Auscultation: Breath Sounds:-Clear even and unlabored.  Cardiovascular Auscultation:Rythm- Regular, rate and rhythm. Murmurs & Other Heart Sounds:Ausculatation of the heart reveal- No Murmurs.  Lymphatic Head & Neck General Head & Neck Lymphatics: Bilateral: Description- bilateral faint swollen submandibular nodes. Also maybe slight enlarge preauricular lymph node on rt side.      Assessment & Plan:  For your rt upper eye lid which appears to have started out as stye and now abscess formed. We called to try to get you in asap with opthalmologist. Due to location prefer that specialist perform the procedure. We got you appointment for today. But you can't go. So  rx ampicillin to start during the interim. But please call Eye MD today and reshcedule for tomorrow. If by chance area worsens with spreading redness then ED evaluation.  My sources say ampicillin is  cat b med for breast feeding. But our computer gave contradictory info. So please clarify with your pharmacist.  Limited choices on oral antibiotic since allergic to keflex, and breast feeding. Pt knows can take ampicillin. She got in hospital with no reaction.  Follow up with [place] as needed after ophthalmologist visit.

## 2015-08-06 NOTE — Patient Instructions (Addendum)
For your rt upper eye lid which appears to have started out as stye and now abscess formed. We called to try to get you in asap with opthalmologist. Due to location prefer that specialist perform the procedure. We got you appointment for today. But you can't go. So will rx  rx ampicillin to start during the interim. But please call  Eye MD today and reshcedule for tomorrow. If by chance area worsens with spreading redness then ED evaluation.  My sources say ampicillin is  cat b med for breast feeding. But our computer gave contradictory info. So please clarify with your pharmacist.  Follow up with [place] as needed after ophthalmologist visit.

## 2015-10-11 ENCOUNTER — Other Ambulatory Visit: Payer: Self-pay | Admitting: Family Medicine

## 2015-10-11 DIAGNOSIS — F329 Major depressive disorder, single episode, unspecified: Secondary | ICD-10-CM

## 2015-10-11 DIAGNOSIS — F32A Depression, unspecified: Secondary | ICD-10-CM

## 2015-12-26 ENCOUNTER — Other Ambulatory Visit: Payer: Self-pay | Admitting: Family Medicine

## 2015-12-26 DIAGNOSIS — F329 Major depressive disorder, single episode, unspecified: Secondary | ICD-10-CM

## 2015-12-26 DIAGNOSIS — F32A Depression, unspecified: Secondary | ICD-10-CM

## 2016-03-06 ENCOUNTER — Ambulatory Visit (INDEPENDENT_AMBULATORY_CARE_PROVIDER_SITE_OTHER): Payer: BC Managed Care – PPO | Admitting: Medical

## 2016-03-06 VITALS — BP 119/64 | HR 95 | Temp 98.5°F | Resp 18 | Wt 246.6 lb

## 2016-03-06 DIAGNOSIS — R42 Dizziness and giddiness: Secondary | ICD-10-CM

## 2016-03-06 DIAGNOSIS — R600 Localized edema: Secondary | ICD-10-CM | POA: Diagnosis not present

## 2016-03-06 MED ORDER — HYDROCHLOROTHIAZIDE 12.5 MG PO CAPS: ORAL_CAPSULE | ORAL | 0 refills | Status: DC

## 2016-03-06 NOTE — Patient Instructions (Addendum)
For your recent symmetric pedal edema. I recommend elevate legs daily, use compression stocking, and can use brief course hctz low dose.  Based on your medical history, your described concerns  and pedal edema recent can get labs next week.(lab closed presently)  If you feel like swelling not going down then get cxr next week as well.  If you get any sob, asymmetric calf swelling or pain behind leg then let us know and will get stat lower ext ultrasounds. If this were to occur after hours or on weekend then ED evaluation.  Follow up in 1-2 weeks or as needed

## 2016-03-06 NOTE — Progress Notes (Signed)
Subjective:    Patient ID: Mariah Brown, female    DOB: 1975-12-26, 41 y.o.   MRN: [id number]  HPI  Pt in with some bilateral lower ankle and feet swelling. This happened about 1-2 weeks ago. Pt checked pregnancy test this weekend and it was negative. Pt last menstrual was Feb 29, 2016. Normal and came on at expected time. Pt has been breast feeding for past 3 years.  Stopped breast feeding 1 month ago. Recent with leg swelling she checked to makes sure not pregnant and test was negative.   Pt recently mild swimmy headed at times and had some vertigo follwing her recent menstual cycle which was heavy compared to prior menses. But light headed sensation has resolved.   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 Pt has been has elevated her legs at night and swelling has gone down some. But  then swells again next day. Pt not sure if she has gained any weight over last 2 weeks.   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   Review of Systems  Constitutional: Negative for chills, diaphoresis, fatigue and fever.  Respiratory: Negative for cough, chest tightness, shortness of breath and wheezing.   Cardiovascular: Negative for chest pain and palpitations.  Gastrointestinal: Negative for abdominal pain, constipation, diarrhea, nausea and vomiting.  Musculoskeletal: Negative for back pain, joint swelling, neck pain and neck stiffness.       Lower ext pedal edema.  Skin: Negative for rash.  Neurological: Negative for dizziness, seizures and headaches.       Mild dizzy light headed around last menses.  Hematological: Negative for adenopathy. Does not bruise/bleed easily.  Psychiatric/Behavioral: Negative for behavioral problems, confusion and sleep disturbance. The patient is not hyperactive.     Past Medical History:  Diagnosis Date  . Clotting disorder (HCC)    PAI-1; on heparin  .  Depression   . Lichen sclerosus   . Status post repeat low transverse cesarean section (4/11) 04/23/2013     Social History   Social History  . Marital status: Married    Spouse name: N/A  . Number of children: N/A  . Years of education: N/A   Occupational History  . Not on file.   Social History Main Topics  . Smoking status: Never Smoker  . Smokeless tobacco: Never Used  . Alcohol use No  . Drug use: No  . Sexual activity: Yes   Other Topics Concern  . Not on file   Social History Narrative  . No narrative on file    Past Surgical History:  Procedure Laterality Date  . CESAREAN SECTION    . CESAREAN SECTION N/A 04/23/2013   Procedure: Repeat Cesarean Section Delivery Baby Boy @ 1122, Apgars 9/9;  Surgeon: Lenoard Aden, MD;  Location: [facility];  Service: Obstetrics;  Laterality: N/A;    Family History  Problem Relation Age of Onset  . Cancer Mother     colon  . Hypertension Father     Allergies  Allergen Reactions  . Keflex [Cephalexin] Hives    Pt can take PCN's  . Macrobid [Nitrofurantoin Macrocrystal] Hives    Current Outpatient Prescriptions on File Prior to Visit  Medication Sig Dispense Refill  . ampicillin (PRINCIPEN) 500 MG capsule Take 1 capsule (500 mg total) by mouth 4 (four) times daily. 40 capsule 0  . clobetasol ointment (TEMOVATE) 0.05 %  Apply 1 application topically 2 (two) times daily as needed.    . sertraline (ZOLOFT) 50 MG tablet TAKE 1 TABLET BY MOUTH DAILY 30 tablet 2   No current facility-administered medications on file prior to visit.     BP 119/64 (BP Location: Left Arm, Patient Position: Sitting, Cuff Size: Large)   Pulse 95   Temp 98.5 F (36.9 C) (Oral)   Resp 18   Wt 246 lb 9.6 oz (111.9 kg)   LMP 02/21/2016   SpO2 100%   BMI 38.62 kg/m       Objective:   Physical Exam   General Mental Status- Alert. General Appearance- Not in acute distress.   Skin General: Color- Normal Color. Moisture- Normal  Moisture.  Neck Carotid Arteries- Normal color. Moisture- Normal Moisture. No carotid bruits. No JVD.  Chest and Lung Exam Auscultation: Breath Sounds:-Normal. Clear even unlabored.   Cardiovascular Auscultation:Rythm- Regular. Murmurs & Other Heart Sounds:Auscultation of the heart reveals- No Murmurs.  Abdomen Inspection:-Inspeection Normal. Palpation/Percussion:Note:No mass. Palpation and Percussion of the abdomen reveal- Non Tender, Non Distended + BS, no rebound or guarding.  Neurologic Cranial Nerve exam:- CN III-XII intact(No nystagmus), symmetric smile. Strength:- 5/5 equal and symmetric strength both upper and lower extremities.  Lower ext- symmetric calves and feet. 1+ pedal edema lower calf. No warmth, no tenderness, Negative homan signs.       Assessment & Plan:  For your recent symmetric pedal edema. I recommend elevate legs daily, use compression stocking, and can use brief course hctz low dose.  Based on your medical history, your described concerns  and pedal edema recent can get labs next week.(lab closed presently)  If you feel like swelling not going down then get cxr next week as well.  If you get any sob, asymmetric calf swelling or pain behind leg then let us know and will get stat lower ext ultrasounds. If this were to occur after hours or on weekend then ED evaluation.  Follow up in 1-2 weeks or as needed  Note pt likely has dependant edema. Discussed this with pt.   If pt dose develop dyspnea would also add bnp to labs.  Hadeel Hillebrand, Ramon DredgeEdward, PA-C

## 2016-03-06 NOTE — Progress Notes (Signed)
Pre visit review using our clinic review tool, if applicable. No additional management support is needed unless otherwise documented below in the visit note. 

## 2016-03-10 ENCOUNTER — Other Ambulatory Visit: Payer: BC Managed Care – PPO

## 2016-03-11 ENCOUNTER — Telehealth: Payer: Self-pay | Admitting: Family Medicine

## 2016-03-11 ENCOUNTER — Other Ambulatory Visit: Payer: BC Managed Care – PPO

## 2016-03-11 LAB — CBC WITH DIFFERENTIAL/PLATELET
BASOS ABS: 0.1 10*3/uL (ref 0.0–0.1)
Basophils Relative: 1.3 % (ref 0.0–3.0)
EOS ABS: 0.3 10*3/uL (ref 0.0–0.7)
Eosinophils Relative: 5.4 % — ABNORMAL HIGH (ref 0.0–5.0)
HEMATOCRIT: 39.4 % (ref 36.0–46.0)
Hemoglobin: 13.4 g/dL (ref 12.0–15.0)
LYMPHS PCT: 27.1 % (ref 12.0–46.0)
Lymphs Abs: 1.7 10*3/uL (ref 0.7–4.0)
MCHC: 34 g/dL (ref 30.0–36.0)
MCV: 89.3 fl (ref 78.0–100.0)
MONOS PCT: 10.4 % (ref 3.0–12.0)
Monocytes Absolute: 0.6 10*3/uL (ref 0.1–1.0)
Neutro Abs: 3.5 10*3/uL (ref 1.4–7.7)
Neutrophils Relative %: 55.8 % (ref 43.0–77.0)
PLATELETS: 171 10*3/uL (ref 150.0–400.0)
RBC: 4.42 Mil/uL (ref 3.87–5.11)
RDW: 13.1 % (ref 11.5–15.5)
WBC: 6.2 10*3/uL (ref 4.0–10.5)

## 2016-03-11 LAB — COMPREHENSIVE METABOLIC PANEL
ALBUMIN: 4 g/dL (ref 3.5–5.2)
ALK PHOS: 41 U/L (ref 39–117)
ALT: 24 U/L (ref 0–35)
AST: 31 U/L (ref 0–37)
BILIRUBIN TOTAL: 0.5 mg/dL (ref 0.2–1.2)
BUN: 13 mg/dL (ref 6–23)
CALCIUM: 9.2 mg/dL (ref 8.4–10.5)
CO2: 28 mEq/L (ref 19–32)
Chloride: 103 mEq/L (ref 96–112)
Creatinine, Ser: 0.88 mg/dL (ref 0.40–1.20)
GFR: 75.86 mL/min (ref >60.00)
Glucose, Bld: 90 mg/dL (ref 70–99)
Potassium: 3.9 mEq/L (ref 3.5–5.1)
Sodium: 138 mEq/L (ref 135–145)
TOTAL PROTEIN: 6.7 g/dL (ref 6.0–8.3)

## 2016-03-11 MED ORDER — HYDROCHLOROTHIAZIDE 25 MG PO TABS: 25.0000 mg | ORAL_TABLET | Freq: Every day | ORAL | 1 refills | Status: DC

## 2016-03-11 NOTE — Telephone Encounter (Signed)
Patient informed of PCP instructions. Sent in hctz 25 to her pharmacy. Appt. Scheduled.

## 2016-03-11 NOTE — Telephone Encounter (Signed)
Called left message to call back 

## 2016-03-11 NOTE — Telephone Encounter (Signed)
Pt states edema is not improving---  Labs are normal Increase HCTZ to 25 mg daily #30  --- drink oj or eat banana daily F/u with me in 2 weeks

## 2016-03-12 ENCOUNTER — Telehealth: Payer: Self-pay | Admitting: Medical

## 2016-03-12 NOTE — Telephone Encounter (Signed)
Would you investigate last note regarding clindamycin? Reviewed pt chart and did not find any note about clindamyin? Let me know what you find out.?

## 2016-03-18 NOTE — Telephone Encounter (Signed)
Nothing noted in patient's chart regarding clindamycin.  Several attempts made to reach patient with no return call. Will close chart until patient returns call.

## 2016-03-21 ENCOUNTER — Telehealth: Payer: Self-pay | Admitting: Family Medicine

## 2016-03-21 ENCOUNTER — Ambulatory Visit (INDEPENDENT_AMBULATORY_CARE_PROVIDER_SITE_OTHER): Payer: BC Managed Care – PPO | Admitting: Family Medicine

## 2016-03-21 ENCOUNTER — Encounter: Payer: Self-pay | Admitting: Family Medicine

## 2016-03-21 VITALS — BP 110/70 | HR 99 | Temp 98.3°F | Resp 16 | Ht 67.0 in | Wt 245.0 lb

## 2016-03-21 DIAGNOSIS — R601 Generalized edema: Secondary | ICD-10-CM | POA: Diagnosis not present

## 2016-03-21 NOTE — Progress Notes (Signed)
Pre visit review using our clinic review tool, if applicable. No additional management support is needed unless otherwise documented below in the visit note. 

## 2016-03-21 NOTE — Patient Instructions (Signed)
Edema Edema is an abnormal buildup of fluids in your bodytissues. Edema is somewhatdependent on gravity to pull the fluid to the lowest place in your body. That makes the condition more common in the legs and thighs (lower extremities). Painless swelling of the feet and ankles is common and becomes more likely as you get older. It is also common in looser tissues, like around your eyes. When the affected area is squeezed, the fluid may move out of that spot and leave a dent for a few moments. This dent is called pitting. What are the causes? There are many possible causes of edema. Eating too much salt and being on your feet or sitting for a long time can cause edema in your legs and ankles. Hot weather may make edema worse. Common medical causes of edema include:  Heart failure.  Liver disease.  Kidney disease.  Weak blood vessels in your legs.  Cancer.  An injury.  Pregnancy.  Some medications.  Obesity.  What are the signs or symptoms? Edema is usually painless.Your skin may look swollen or shiny. How is this diagnosed? Your health care provider may be able to diagnose edema by asking about your medical history and doing a physical exam. You may need to have tests such as X-rays, an electrocardiogram, or blood tests to check for medical conditions that may cause edema. How is this treated? Edema treatment depends on the cause. If you have heart, liver, or kidney disease, you need the treatment appropriate for these conditions. General treatment may include:  Elevation of the affected body part above the level of your heart.  Compression of the affected body part. Pressure from elastic bandages or support stockings squeezes the tissues and forces fluid back into the blood vessels. This keeps fluid from entering the tissues.  Restriction of fluid and salt intake.  Use of a water pill (diuretic). These medications are appropriate only for some types of edema. They pull fluid  out of your body and make you urinate more often. This gets rid of fluid and reduces swelling, but diuretics can have side effects. Only use diuretics as directed by your health care provider.  Follow these instructions at home:  Keep the affected body part above the level of your heart when you are lying down.  Do not sit still or stand for prolonged periods.  Do not put anything directly under your knees when lying down.  Do not wear constricting clothing or garters on your upper legs.  Exercise your legs to work the fluid back into your blood vessels. This may help the swelling go down.  Wear elastic bandages or support stockings to reduce ankle swelling as directed by your health care provider.  Eat a low-salt diet to reduce fluid if your health care provider recommends it.  Only take medicines as directed by your health care provider. Contact a health care provider if:  Your edema is not responding to treatment.  You have heart, liver, or kidney disease and notice symptoms of edema.  You have edema in your legs that does not improve after elevating them.  You have sudden and unexplained weight gain. Get help right away if:  You develop shortness of breath or chest pain.  You cannot breathe when you lie down.  You develop pain, redness, or warmth in the swollen areas.  You have heart, liver, or kidney disease and suddenly get edema.  You have a fever and your symptoms suddenly get worse. This information is   not intended to replace advice given to you by your health care provider. Make sure you discuss any questions you have with your health care provider. Document Released: 12/30/2004 Document Revised: 06/07/2015 Document Reviewed: 10/22/2012 Elsevier Interactive Patient Education  2017 [employer]vier Inc[employer]  

## 2016-03-21 NOTE — Telephone Encounter (Signed)
-----   Message from Donato SchultzYvonne R Lowne Chase, DO sent at 03/21/2016  7:40 AM EST ----- We need to get her in in next 2 weeks for f/u

## 2016-03-21 NOTE — Telephone Encounter (Signed)
Called left message to call and scheduled appt today with PCP.

## 2016-03-21 NOTE — Telephone Encounter (Signed)
Spoke to the patient and she agreed to come in today at 4. She is on the scheduled for same day 4 pm appt.

## 2016-03-21 NOTE — Telephone Encounter (Signed)
Called the patient twice this morning to schedule appointment today per PCP instructions.

## 2016-03-21 NOTE — Telephone Encounter (Signed)
-----   Message from Yvonne R Lowne Chase, DO sent at 03/21/2016  7:40 AM EST ----- We need to get her in in next 2 weeks for f/u 

## 2016-03-21 NOTE — Assessment & Plan Note (Signed)
Elevate legs con't hctz daily Compression socks

## 2016-03-21 NOTE — Progress Notes (Signed)
Patient ID: Mariah Brown, female   DOB: 12/27/1975, 42 y.o.   MRN: [id number]     Subjective:  I acted as a Neurosurgeon for Dr. Zola Button.  Apolonio Schneiders, CMA   Patient ID: Mariah Brown, female    DOB: 21-Nov-1975, 41 y.o.   MRN: [id number]  Chief Complaint  Patient presents with  . Edema    both legs    HPI  Patient is in today for follow up edema in both legs.  She was seen on 03/06/16.  She states that it seems that the water pill has helped a little.  She started the 25mg  does 2 days ago.  She was on 12.5mg .  Patient Care Team: Donato Schultz, DO as PCP - General (Family Medicine)   Past Medical History:  Diagnosis Date  . Clotting disorder (HCC)    PAI-1; on heparin  . Depression   . Lichen sclerosus   . Status post repeat low transverse cesarean section (4/11) 04/23/2013    Past Surgical History:  Procedure Laterality Date  . CESAREAN SECTION    . CESAREAN SECTION N/A 04/23/2013   Procedure: Repeat Cesarean Section Delivery Baby Boy @ 1122, Apgars 9/9;  Surgeon: Lenoard Aden, MD;  Location: [facility];  Service: Obstetrics;  Laterality: N/A;    Family History  Problem Relation Age of Onset  . Cancer Mother     colon  . Hypertension Father     Social History   Social History  . Marital status: Married    Spouse name: N/A  . Number of children: N/A  . Years of education: N/A   Occupational History  . Not on file.   Social History Main Topics  . Smoking status: Never Smoker  . Smokeless tobacco: Never Used  . Alcohol use No  . Drug use: No  . Sexual activity: Yes   Other Topics Concern  . Not on file   Social History Narrative  . No narrative on file    Outpatient Medications Prior to Visit  Medication Sig Dispense Refill  . hydrochlorothiazide (HYDRODIURIL) 25 MG tablet Take 1 tablet (25 mg total) by mouth daily. 30 tablet 1  . ampicillin (PRINCIPEN) 500 MG capsule Take 1 capsule (500 mg total) by mouth 4 (four) times daily. 40 capsule 0    . clobetasol ointment (TEMOVATE) 0.05 % Apply 1 application topically 2 (two) times daily as needed.    . sertraline (ZOLOFT) 50 MG tablet TAKE 1 TABLET BY MOUTH DAILY 30 tablet 2   No facility-administered medications prior to visit.     Allergies  Allergen Reactions  . Keflex [Cephalexin] Hives    Pt can take PCN's  . Macrobid [Nitrofurantoin Macrocrystal] Hives    Review of Systems  Constitutional: Negative for fever and malaise/fatigue.  HENT: Negative for congestion.   Eyes: Negative for blurred vision.  Respiratory: Negative for cough and shortness of breath.   Cardiovascular: Negative for chest pain, palpitations and leg swelling.  Gastrointestinal: Negative for vomiting.  Musculoskeletal: Negative for back pain.  Skin: Negative for rash.  Neurological: Negative for loss of consciousness and headaches.       Objective:    Physical Exam  Constitutional: She is oriented to person, place, and time. She appears well-developed and well-nourished. No distress.  HENT:  Head: Normocephalic and atraumatic.  Eyes: Conjunctivae are normal.  Neck: Normal range of motion. No thyromegaly present.  Cardiovascular: Normal rate and regular rhythm.   Pulmonary/Chest: Effort normal and  breath sounds normal. She has no wheezes.  Abdominal: Soft. Bowel sounds are normal. There is no tenderness.  Musculoskeletal: Normal range of motion. She exhibits no edema or deformity.  Neurological: She is alert and oriented to person, place, and time.  Skin: Skin is warm and dry. She is not diaphoretic.  Psychiatric: She has a normal mood and affect.    BP 110/70 (BP Location: Left Arm, Cuff Size: Large)   Pulse 99   Temp 98.3 F (36.8 C) (Oral)   Resp 16   Ht 5\' 7"  (1.702 m)   Wt 245 lb (111.1 kg)   LMP 02/21/2016   SpO2 98%   BMI 38.37 kg/m  Wt Readings from Last 3 Encounters:  03/21/16 245 lb (111.1 kg)  03/06/16 246 lb 9.6 oz (111.9 kg)  08/06/15 231 lb 12.8 oz (105.1 kg)      Lab Results  Component Value Date   WBC 6.2 03/10/2016   HGB 13.4 03/10/2016   HCT 39.4 03/10/2016   PLT 171.0 03/10/2016   GLUCOSE 90 03/10/2016   ALT 24 03/10/2016   AST 31 03/10/2016   NA 138 03/10/2016   K 3.9 03/10/2016   CL 103 03/10/2016   CREATININE 0.88 03/10/2016   BUN 13 03/10/2016   CO2 28 03/10/2016   INR 1.01 04/21/2013    No results found for: TSH Lab Results  Component Value Date   WBC 6.2 03/10/2016   HGB 13.4 03/10/2016   HCT 39.4 03/10/2016   MCV 89.3 03/10/2016   PLT 171.0 03/10/2016   Lab Results  Component Value Date   NA 138 03/10/2016   K 3.9 03/10/2016   CO2 28 03/10/2016   GLUCOSE 90 03/10/2016   BUN 13 03/10/2016   CREATININE 0.88 03/10/2016   BILITOT 0.5 03/10/2016   ALKPHOS 41 03/10/2016   AST 31 03/10/2016   ALT 24 03/10/2016   PROT 6.7 03/10/2016   ALBUMIN 4.0 03/10/2016   CALCIUM 9.2 03/10/2016   GFR 75.86 03/10/2016   No results found for: CHOL No results found for: HDL No results found for: LDLCALC No results found for: TRIG No results found for: CHOLHDL No results found for: [id number]     Assessment & Plan:   Problem List Items Addressed This Visit      Unprioritized   Generalized edema - Primary    Elevate legs con't hctz daily Compression socks      Relevant Orders   Basic metabolic panel      I have discontinued Ms. Carriger's clobetasol ointment, ampicillin, and sertraline. I am also having her maintain her hydrochlorothiazide.  No orders of the defined types were placed in this encounter.   CMA served as Neurosurgeonscribe during this visit. History, Physical and Plan performed by medical provider. Documentation and orders reviewed and attested to.  Donato SchultzYvonne R Lowne Chase, DO

## 2016-03-21 NOTE — Telephone Encounter (Signed)
Patient contacted and scheduled at 4 today.

## 2016-03-21 NOTE — Telephone Encounter (Signed)
Called left message to call back 

## 2016-03-24 ENCOUNTER — Other Ambulatory Visit: Payer: BC Managed Care – PPO

## 2016-03-25 ENCOUNTER — Other Ambulatory Visit: Payer: BC Managed Care – PPO

## 2016-03-30 ENCOUNTER — Other Ambulatory Visit: Payer: Self-pay | Admitting: Family Medicine

## 2016-03-30 ENCOUNTER — Encounter: Payer: Self-pay | Admitting: Family Medicine

## 2016-03-30 DIAGNOSIS — R609 Edema, unspecified: Secondary | ICD-10-CM

## 2016-03-30 MED ORDER — FUROSEMIDE 20 MG PO TABS: 20.0000 mg | ORAL_TABLET | Freq: Every day | ORAL | 3 refills | Status: DC

## 2016-04-03 ENCOUNTER — Telehealth: Payer: Self-pay

## 2016-04-03 NOTE — Telephone Encounter (Signed)
Called patient to follow up.  Left a message for call back.

## 2016-04-03 NOTE — Telephone Encounter (Signed)
-----   Message from Donato SchultzYvonne R Lowne Chase, [place] sent at 04/02/2016  9:24 PM EDT ----- Pt called-- having chest pain and sob --- advised she go to er but she says she is just stressed out-- she needs an appointment tomorrow--Thursday if she does not go to the ER

## 2016-04-07 ENCOUNTER — Other Ambulatory Visit: Payer: BC Managed Care – PPO

## 2016-04-09 ENCOUNTER — Telehealth: Payer: Self-pay | Admitting: Family Medicine

## 2016-04-09 NOTE — Telephone Encounter (Signed)
If she does not want to be seen I think ok to speak with Dr. Laury AxonLowne soon- will send note to her

## 2016-04-09 NOTE — Telephone Encounter (Signed)
Patient Name: Mariah Brown DOB: Jul 28, 1975 Initial Comment Caller says, very swollen feet, feels dizzy, now Rt arm went numb and is tingling Nurse Assessment Nurse: Charna Elizabethrumbull, RN, Lynden Angathy Date/Time (Eastern Time): 04/09/2016 1:11:52 PM Confirm and document reason for call. If symptomatic, describe symptoms. ---Caller states she developed numbness of her right arm today and she has had dizziness the past several weeks. No chest pain. No breathing difficulty. Alert and responsive. Does the patient have any new or worsening symptoms? ---Yes Will a triage be completed? ---Yes Related visit to physician within the last 2 weeks? ---No Does the PT have any chronic conditions? (i.e. diabetes, asthma, etc.) ---No Is the patient pregnant or possibly pregnant? (Ask all females between the ages of 7812-55) ---No Is this a behavioral health or substance abuse call? ---No Guidelines Guideline Title Affirmed Question Affirmed Notes Neurologic Deficit [1] Numbness (i.e., loss of sensation) of the face, arm / hand, or leg / foot on one side of the body AND [2] sudden onset AND [3] brief (now gone) Dizziness - Lightheadedness [1] MODERATE dizziness (e.g., interferes with normal activities) AND [2] has NOT been evaluated by physician for this (Exception: dizziness caused by heat exposure, sudden standing, or poor fluid intake) Final Disposition User Go to ED Now (or PCP triage) Charna Elizabethrumbull, RN, Cathy Comments Unable to schedule a quick appointment at an office the caller is agreeable to going to and she doesn't want to go to the ER. She asks if Dr. Laury AxonLowne can fit her in. Called the office backline and Eber JonesCarolyn will assist her further. Referrals GO TO FACILITY REFUSED Disagree/Comply: Comply

## 2016-04-09 NOTE — Telephone Encounter (Signed)
Spoke with patient. She is continuing to experience lower extremity edema as well as swelling in her hands. She was seen by PCP on 03/06/16 and 04/09/16 for this complaint. She reports compliance w/ daily lasix 20 mg, but has had no improvement in swelling. She is also experiencing dizziness that seems to be slightly worse when she lays down, but most it "comes out of nowhere." She had a transient episode of R hand numbness today that resolved with stretching. Offered patient a same day appointment, but she states she is at work and does not get off until 4pm and cannot leave early. I am unable to find a later appointment. Recommended ER or walk-in UC w/ extended hours, but patient stated she would just talk to Dr. Laury AxonLowne once she was back in the office. Please advise any additional recommendations?

## 2016-04-10 ENCOUNTER — Other Ambulatory Visit: Payer: Self-pay | Admitting: Family Medicine

## 2016-04-10 DIAGNOSIS — M7989 Other specified soft tissue disorders: Secondary | ICD-10-CM

## 2016-04-10 NOTE — Telephone Encounter (Signed)
Called left message to call back 

## 2016-04-10 NOTE — Telephone Encounter (Signed)
Please get b/l dopplers on legs-- stop lasix-- that may be whats causing dizziness and put referral into vascular If she is having chest pain still (she mentioned that previously ) she needs to go to urgent care.

## 2016-04-10 NOTE — Telephone Encounter (Signed)
Both referrals entered. Called the patient to inform--no answer/left detailed message to call back.

## 2016-04-14 NOTE — Telephone Encounter (Signed)
Called left message to call back 

## 2016-04-17 NOTE — Telephone Encounter (Signed)
Called the patient left a message again. No return phone call will close the message. She has appt with PCP on 4/256/18.

## 2016-05-08 ENCOUNTER — Ambulatory Visit: Payer: BC Managed Care – PPO | Admitting: Family Medicine

## 2016-05-26 ENCOUNTER — Encounter: Payer: Self-pay | Admitting: Vascular Surgery

## 2016-05-27 ENCOUNTER — Other Ambulatory Visit: Payer: Self-pay

## 2016-05-27 DIAGNOSIS — M7989 Other specified soft tissue disorders: Secondary | ICD-10-CM

## 2016-05-30 ENCOUNTER — Encounter: Payer: Self-pay | Admitting: Vascular Surgery

## 2016-05-30 ENCOUNTER — Ambulatory Visit (INDEPENDENT_AMBULATORY_CARE_PROVIDER_SITE_OTHER): Payer: BC Managed Care – PPO | Admitting: Vascular Surgery

## 2016-05-30 ENCOUNTER — Ambulatory Visit (HOSPITAL_COMMUNITY)
Admission: RE | Admit: 2016-05-30 | Discharge: 2016-05-30 | Disposition: A | Discharge status: Home / Self Care | Payer: BC Managed Care – PPO | Source: Ambulatory Visit | Attending: Vascular Surgery | Admitting: Vascular Surgery

## 2016-05-30 VITALS — BP 115/81 | HR 97 | Temp 98.3°F | Resp 16 | Ht 67.0 in | Wt 238.0 lb

## 2016-05-30 DIAGNOSIS — M7989 Other specified soft tissue disorders: Secondary | ICD-10-CM | POA: Diagnosis not present

## 2016-05-30 DIAGNOSIS — I8393 Asymptomatic varicose veins of bilateral lower extremities: Secondary | ICD-10-CM | POA: Insufficient documentation

## 2016-05-30 DIAGNOSIS — R600 Localized edema: Secondary | ICD-10-CM

## 2016-05-30 NOTE — Progress Notes (Signed)
Patient ID: Mariah SalisburyJessica C Valdivia, female   DOB: 01/16/1975, 41 y.o.   MRN: [id number]  Reason for Consult: New Evaluation (Epic Referral-[facility] SW-Swelling in legs, LE venous reflux. )   Referred by Zola ButtonLowne Chase, Grayling CongressYvonne R, *  Subjective:     HPI:  Mariah Brown is a 41 y.o. female with a two-month history of leg swelling. She states she has never had a DVT per se but does have a clotting disorder that she was treated with heparin during her pregnancy. She does not have any limitation to her walking. She does wear compression stockings with significant improvement in her swelling by the end of the day start to cause pain themselves. She also has numbness in her right hand that occurs occasionally. She does not have a family history of DVT. Other than her 2 C-sections she has never had abdominal surgery is never had cancer or radiation. She does not have varicose veins although she does have some spider veins. She is not worried about cosmesis at this time. She is able to walk without limitations. She has no other complaints related to today's issue.  Past Medical History:  Diagnosis Date  . Clotting disorder (HCC)    PAI-1; on heparin  . Depression   . Lichen sclerosus   . Status post repeat low transverse cesarean section (4/11) 04/23/2013   Family History  Problem Relation Age of Onset  . Cancer Mother        colon  . Hypertension Father    Past Surgical History:  Procedure Laterality Date  . CESAREAN SECTION    . CESAREAN SECTION N/A 04/23/2013   Procedure: Repeat Cesarean Section Delivery Baby Boy @ 1122, Apgars 9/9;  Surgeon: Lenoard Adenichard J Taavon, MD;  Location: [facility];  Service: Obstetrics;  Laterality: N/A;    Short Social History:  Social History  Substance Use Topics  . Smoking status: Never Smoker  . Smokeless tobacco: Never Used  . Alcohol use No    Allergies  Allergen Reactions  . Keflex [Cephalexin] Hives    Pt can take PCN's  . Macrobid [Nitrofurantoin  Macrocrystal] Hives    No current outpatient prescriptions on file.   No current facility-administered medications for this visit.     Review of Systems  Constitutional:  Constitutional negative. HENT: HENT negative.  Eyes: Eyes negative.  Respiratory: Respiratory negative.  Cardiovascular: Positive for leg swelling.  GI: Gastrointestinal negative.  Musculoskeletal: Musculoskeletal negative.  Skin: Skin negative.  Neurological: Positive for numbness.  Psychiatric: Psychiatric negative.        Objective:  Objective   Vitals:   05/30/16 1415  BP: 115/81  Pulse: 97  Resp: 16  Temp: 98.3 F (36.8 C)  TempSrc: Oral  SpO2: 100%  Weight: 238 lb (108 kg)  Height: 5\' 7"  (1.702 m)   Body mass index is 37.28 kg/m.  Physical Exam  Constitutional: She is oriented to person, place, and time. She appears well-developed.  HENT:  Head: Normocephalic.  Neck: Normal range of motion.  Cardiovascular: Normal rate.   Pulses:      Dorsalis pedis pulses are 1+ on the right side, and 1+ on the left side.  Pulmonary/Chest: Effort normal.  Abdominal: Soft.  Musculoskeletal: She exhibits edema.  Neurological: She is alert and oriented to person, place, and time.  Skin: Skin is warm and dry.  Psychiatric: She has a normal mood and affect. Her behavior is normal. Judgment and thought content normal.    Data: I  have independently interpreted her venous reflux studies which demonstrate common femoral reflux bilaterally with no saphenous vein reflux on either leg.     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 She is wearing compression stockings. I discussed with her continued ambulation. I've also discussed with her the need for weight loss and she is in agreement. 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 We discussed elevation of her legs and specifically the lounge doctor as well as leg compression in the time to use it. She demonstrated very good understanding. All of her questions are answered. She can follow-up on an as-needed basis.     Maeola Harman MD [facility]

## 2018-07-27 ENCOUNTER — Other Ambulatory Visit: Payer: Self-pay | Admitting: Obstetrics and Gynecology

## 2018-07-27 DIAGNOSIS — R928 Other abnormal and inconclusive findings on diagnostic imaging of breast: Secondary | ICD-10-CM

## 2018-07-30 ENCOUNTER — Other Ambulatory Visit: Payer: Self-pay | Admitting: Obstetrics and Gynecology

## 2018-07-30 ENCOUNTER — Ambulatory Visit
Admission: RE | Admit: 2018-07-30 | Discharge: 2018-07-30 | Disposition: A | Discharge status: Home / Self Care | Payer: BC Managed Care – PPO | Source: Ambulatory Visit | Attending: Obstetrics and Gynecology | Admitting: Obstetrics and Gynecology

## 2018-07-30 ENCOUNTER — Other Ambulatory Visit: Payer: Self-pay

## 2018-07-30 DIAGNOSIS — R928 Other abnormal and inconclusive findings on diagnostic imaging of breast: Secondary | ICD-10-CM

## 2018-07-30 DIAGNOSIS — N6489 Other specified disorders of breast: Secondary | ICD-10-CM

## 2018-08-03 ENCOUNTER — Other Ambulatory Visit: Payer: Self-pay

## 2018-08-03 ENCOUNTER — Ambulatory Visit
Admission: RE | Admit: 2018-08-03 | Discharge: 2018-08-03 | Disposition: A | Discharge status: Home / Self Care | Payer: BC Managed Care – PPO | Source: Ambulatory Visit | Attending: Obstetrics and Gynecology | Admitting: Obstetrics and Gynecology

## 2018-08-03 DIAGNOSIS — N6489 Other specified disorders of breast: Secondary | ICD-10-CM

## 2018-09-17 ENCOUNTER — Other Ambulatory Visit: Payer: Self-pay

## 2018-09-17 DIAGNOSIS — Z20822 Contact with and (suspected) exposure to covid-19: Secondary | ICD-10-CM

## 2018-09-18 ENCOUNTER — Telehealth: Payer: Self-pay | Admitting: *Deleted

## 2018-09-18 LAB — NOVEL CORONAVIRUS, NAA: SARS-CoV-2, NAA: NOT DETECTED

## 2018-09-18 NOTE — Telephone Encounter (Signed)
Patient calling to receive COVID test results. Pt notified that results were not available at this time. Understanding verbalized.

## 2020-06-18 ENCOUNTER — Ambulatory Visit: Payer: BC Managed Care – PPO | Admitting: Podiatry

## 2020-10-17 ENCOUNTER — Other Ambulatory Visit: Payer: Self-pay

## 2020-10-17 ENCOUNTER — Ambulatory Visit: Payer: BC Managed Care – PPO | Admitting: Adult Health

## 2020-10-17 ENCOUNTER — Ambulatory Visit (INDEPENDENT_AMBULATORY_CARE_PROVIDER_SITE_OTHER): Payer: BC Managed Care – PPO

## 2020-10-17 ENCOUNTER — Encounter: Payer: Self-pay | Admitting: Adult Health

## 2020-10-17 VITALS — BP 118/64 | HR 90 | Temp 98.2°F | Ht 67.0 in | Wt 251.0 lb

## 2020-10-17 DIAGNOSIS — J309 Allergic rhinitis, unspecified: Secondary | ICD-10-CM

## 2020-10-17 DIAGNOSIS — J45909 Unspecified asthma, uncomplicated: Secondary | ICD-10-CM | POA: Insufficient documentation

## 2020-10-17 DIAGNOSIS — R059 Cough, unspecified: Secondary | ICD-10-CM

## 2020-10-17 DIAGNOSIS — J4520 Mild intermittent asthma, uncomplicated: Secondary | ICD-10-CM

## 2020-10-17 DIAGNOSIS — Z23 Encounter for immunization: Secondary | ICD-10-CM

## 2020-10-17 LAB — SPIROMETRY WITH GRAPH: Nitric Oxide: 18

## 2020-10-17 MED ORDER — MONTELUKAST SODIUM 10 MG PO TABS: 10.0000 mg | ORAL_TABLET | Freq: Every day | ORAL | 3 refills | Status: DC

## 2020-10-17 MED ORDER — ALBUTEROL SULFATE HFA 108 (90 BASE) MCG/ACT IN AERS: 1.0000 | INHALATION_SPRAY | Freq: Four times a day (QID) | RESPIRATORY_TRACT | 6 refills | Status: DC | PRN

## 2020-10-17 NOTE — Addendum Note (Signed)
Addended by: Demetrio Lapping E on: 10/17/2020 04:23 PM   Modules accepted: Orders

## 2020-10-17 NOTE — Assessment & Plan Note (Signed)
Most likely mild intermittent asthma versus reactive airways with triggers of allergic rhinitis. Check CBC with differential to look at eosinophils and IgE. Control for triggers. Albuterol inhaler as needed Check chest x-ray today Add Singulair daily  Plan  Patient Instructions  Continue on Xyzal 5mg  daily  Continue on Flonase daily  Add Saline spray Twice daily   Add Saline nasal gel At bedtime   Albuterol inhaler 1-2 puffs every 6hr as needed  Add Singulair 10mg  At bedtime   Add Chlorpheniramine 4mg  At bedtime  . (Chlortabs)  Follow up with Dr.  or Lacara Dunsworth NP in 3 months and As needed

## 2020-10-17 NOTE — Assessment & Plan Note (Signed)
Chronic allergic rhinitis.  Patient with significant symptom burden.  Patient is to add in saline nasal spray and saline nasal gel.  Hopefully this will help prevent nasal bleeds with Flonase.  Would restart Flonase daily.  Continue on Xyzal daily. Add Singulair 10 mg daily. Add Chlor-Trimeton 4 mg at bedtime  Plan  Patient Instructions  Continue on Xyzal 5mg  daily  Continue on Flonase daily  Add Saline spray Twice daily   Add Saline nasal gel At bedtime   Albuterol inhaler 1-2 puffs every 6hr as needed  Add Singulair 10mg  At bedtime   Add Chlorpheniramine 4mg  At bedtime  . (Chlortabs)  Follow up with Dr.  or Brayleigh Rybacki NP in 3 months and As needed

## 2020-10-17 NOTE — Patient Instructions (Signed)
Continue on Xyzal 5mg  daily  Continue on Flonase daily  Add Saline spray Twice daily   Add Saline nasal gel At bedtime   Albuterol inhaler 1-2 puffs every 6hr as needed  Add Singulair 10mg  At bedtime   Add Chlorpheniramine 4mg  At bedtime  . (Chlortabs)  Follow up with Dr.  or Johnedward Brodrick NP in 3 months and As needed

## 2020-10-17 NOTE — Progress Notes (Signed)
@Patient  ID: , female    DOB: 10/04/1975, 45 y.o.   MRN: [id number]  Chief Complaint  Patient presents with   Consult    Referring provider: No ref. provider found  HPI: 45 year old female never smoker seen for pulmonary consult October 17, 2020 for cough and shortness of breath Medical history significant for allergies/hayfever .   TEST/EVENTS :   10/17/2020 Pulmonary Consult  Patient presents for pulmonary consult.  Patient complains that over the last year she has been having episodes of increased allergy symptoms.  She has episodes where she has significant coughing chest tightness tickle in the back of her throat and sinus drainage.  She is followed by an allergist.  There is recommendations for her to restart allergy injections.  She is on Xyzal daily.  Does take Flonase occasionally but it causes nosebleeds.  She takes Benadryl often.  Patient says during her attacks sometimes she has to take up to 10 Benadryl a day.  She denies any heartburn or indigestion. Patient says she is currently renovating an older house.  And 2 weeks ago was exposed to a lot of dust and that caused a significant allergy flare.  She had minimally productive cough, chest tightness, shortness of breath and significant nasal symptoms with postnasal drainage drippy nose.  She did have a very old inhaler that she took which did help with her cough.  Patient says she is never been diagnosed with asthma however has been diagnosed with allergies and hayfever most of her life. Patient is a 12/17/2020.  She does have 2 younger children.  She does have an inside dog. Triggers are smoke, perfumes cleaning supplies or any type of heavy fume.  She also has worsening symptoms in spring and fall season.  Today in the office spirometry showed normal lung function with an FEV1 at 92%, ratio 88, FVC 85% Exhaled nitric oxide testing today was 18 ppb     Allergies  Allergen Reactions   Keflex  [Cephalexin] Hives    Pt can take PCN's   Macrobid [Nitrofurantoin Macrocrystal] Hives    Immunization History  Administered Date(s) Administered   Influenza,inj,Quad PF,6+ Mos 10/17/2020   Moderna Sars-Covid-2 Vaccination 03/06/2019, 04/09/2019, 11/29/2019   Tetanus 08/08/2005    Past Medical History:  Diagnosis Date   Clotting disorder (HCC)    PAI-1; on heparin   Depression    Lichen sclerosus    Status post repeat low transverse cesarean section (4/11) 04/23/2013   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 As needed     levocetirizine (XYZAL) 5 MG tablet Take 5 mg by mouth every evening.     No facility-administered medications prior to visit.     Review of Systems:   Constitutional:   No  weight loss, night sweats,  Fevers, chills, fatigue, or  lassitude.  HEENT:   No headaches,  Difficulty swallowing,  Tooth/dental problems, or  Sore throat,                No sneezing, itching, ear ache,  +nasal congestion, post nasal drip,   CV:  No chest pain,  Orthopnea,  PND, swelling in lower extremities, anasarca, dizziness, palpitations, syncope.   GI  No heartburn, indigestion, abdominal pain, nausea, vomiting, diarrhea, change in bowel habits, loss of appetite, bloody stools.   Resp:  No coughing up of blood.  No change in color of mucus.  No wheezing.  No chest wall deformity  Skin: no rash or lesions.  GU: no dysuria, change in color of urine, no urgency or frequency.  No flank pain, no hematuria   MS:  No joint pain or swelling.  No decreased range of motion.  No back pain.    Physical  Exam  BP 118/64 (BP Location: Left Arm, Cuff Size: Large)   Pulse 90   Temp 98.2 F (36.8 C) (Temporal)   Ht 5\' 7"  (1.702 m)   Wt 251 lb (113.9 kg)   SpO2 98%   BMI 39.31 kg/m   GEN: A/Ox3; pleasant , NAD, well nourished    HEENT:  [place]/AT,  , NOSE-clear, THROAT-clear, no lesions, no postnasal drip or exudate noted.   NECK:  Supple w/ fair ROM; no JVD; normal carotid impulses w/o bruits; no thyromegaly or nodules palpated; no lymphadenopathy.    RESP  Clear  P & A; w/o, wheezes/ rales/ or rhonchi. no accessory muscle use, no dullness to percussion  CARD:  RRR, no m/r/g, no peripheral edema, pulses intact, no cyanosis or clubbing.  GI:   Soft & nt; nml bowel sounds; no organomegaly or masses detected.   Musco: Warm bil, no deformities or joint swelling noted.   Neuro: alert, no focal deficits noted.    Skin: Warm, no lesions or rashes    Lab Results:  CBC    Component Value Date/Time   WBC 6.2 03/10/2016 1649   RBC 4.42 03/10/2016 1649   HGB 13.4 03/10/2016 1649   HGB 14.0 03/20/2009 1311   HCT 39.4 03/10/2016 1649   HCT 41.2 03/20/2009 1311   PLT 171.0 03/10/2016 1649   PLT 196 03/20/2009 1311   MCV 89.3 03/10/2016 1649   MCV 91 03/20/2009 1311   MCH 29.9 04/24/2013 0520   MCHC 34.0 03/10/2016 1649   RDW 13.1 03/10/2016 1649   RDW 11.4 03/20/2009 1311   LYMPHSABS 1.7 03/10/2016 1649   LYMPHSABS 2.7 03/20/2009 1311   MONOABS 0.6 03/10/2016 1649   EOSABS 0.3 03/10/2016 1649   EOSABS 0.6 (H) 03/20/2009 1311   BASOSABS 0.1 03/10/2016 1649   BASOSABS 0.1 03/20/2009 1311    BMET    Component Value Date/Time   NA 138 03/10/2016 1649   NA 141 07/05/2008 1249   K 3.9 03/10/2016 1649   K 4.1 07/05/2008 1249   CL 103 03/10/2016 1649   CL 105 07/05/2008 1249   CO2 28 03/10/2016 1649   CO2 26 07/05/2008 1249   GLUCOSE 90 03/10/2016 1649   GLUCOSE 88 07/05/2008 1249   BUN 13 03/10/2016 1649   BUN 13 07/05/2008 1249   CREATININE 0.88 03/10/2016 1649    CREATININE 1.0 07/05/2008 1249   CALCIUM 9.2 03/10/2016 1649   CALCIUM 9.7 07/05/2008 1249   GFRNONAA 53 (L) 04/26/2013 0745   GFRAA 61 (L) 04/26/2013 0745    BNP No results found for: BNP  ProBNP No results found for: PROBNP  Imaging: No results found.    No flowsheet data found.  Lab Results  Component Value Date   NITRICOXIDE 18 10/17/2020        Assessment & Plan:   Allergic rhinitis Chronic allergic rhinitis.  Patient with  significant symptom burden.  Patient is to add in saline nasal spray and saline nasal gel.  Hopefully this will help prevent nasal bleeds with Flonase.  Would restart Flonase daily.  Continue on Xyzal daily. Add Singulair 10 mg daily. Add Chlor-Trimeton 4 mg at bedtime  Plan  Patient Instructions  Continue on Xyzal 5mg  daily  Continue on Flonase daily  Add Saline spray Twice daily   Add Saline nasal gel At bedtime   Albuterol inhaler 1-2 puffs every 6hr as needed  Add Singulair 10mg  At bedtime   Add Chlorpheniramine 4mg  At bedtime  . (Chlortabs)  Follow up with Dr.  or Remigio Mcmillon NP in 3 months and As needed          Asthma Most likely mild intermittent asthma versus reactive airways with triggers of allergic rhinitis. Check CBC with differential to look at eosinophils and IgE. Control for triggers. Albuterol inhaler as needed Check chest x-ray today Add Singulair daily  Plan  Patient Instructions  Continue on Xyzal 5mg  daily  Continue on Flonase daily  Add Saline spray Twice daily   Add Saline nasal gel At bedtime   Albuterol inhaler 1-2 puffs every 6hr as needed  Add Singulair 10mg  At bedtime   Add Chlorpheniramine 4mg  At bedtime  . (Chlortabs)  Follow up with Dr.  or Mallorey Odonell NP in 3 months and As needed            , NP 10/17/2020

## 2020-10-18 LAB — CBC WITH DIFFERENTIAL/PLATELET
Basophils Absolute: 0 K/uL (ref 0.0–0.1)
Basophils Relative: 0.4 % (ref 0.0–3.0)
Eosinophils Absolute: 0.2 K/uL (ref 0.0–0.7)
Eosinophils Relative: 2.7 % (ref 0.0–5.0)
HCT: 37.2 % (ref 36.0–46.0)
Hemoglobin: 12.6 g/dL (ref 12.0–15.0)
Lymphocytes Relative: 24.8 % (ref 12.0–46.0)
Lymphs Abs: 1.9 K/uL (ref 0.7–4.0)
MCHC: 33.8 g/dL (ref 30.0–36.0)
MCV: 89.9 fl (ref 78.0–100.0)
Monocytes Absolute: 0.6 K/uL (ref 0.1–1.0)
Monocytes Relative: 7.7 % (ref 3.0–12.0)
Neutro Abs: 5 K/uL (ref 1.4–7.7)
Neutrophils Relative %: 64.4 % (ref 43.0–77.0)
Platelets: 210 K/uL (ref 150.0–400.0)
RBC: 4.14 Mil/uL (ref 3.87–5.11)
RDW: 13 % (ref 11.5–15.5)
WBC: 7.7 K/uL (ref 4.0–10.5)

## 2020-10-19 LAB — IGE: IgE (Immunoglobulin E), Serum: 39 kU/L (ref <=114)

## 2021-01-28 ENCOUNTER — Ambulatory Visit: Payer: BC Managed Care – PPO | Admitting: Adult Health

## 2021-04-12 ENCOUNTER — Encounter: Payer: Self-pay | Admitting: *Deleted

## 2021-04-12 ENCOUNTER — Encounter: Payer: Self-pay | Admitting: Family Medicine

## 2021-04-12 ENCOUNTER — Ambulatory Visit: Payer: BC Managed Care – PPO | Admitting: Family Medicine

## 2021-04-12 VITALS — BP 130/70 | HR 82 | Temp 98.3°F | Ht 67.0 in | Wt 251.4 lb

## 2021-04-12 DIAGNOSIS — J4520 Mild intermittent asthma, uncomplicated: Secondary | ICD-10-CM | POA: Diagnosis not present

## 2021-04-12 DIAGNOSIS — R490 Dysphonia: Secondary | ICD-10-CM | POA: Diagnosis not present

## 2021-04-12 DIAGNOSIS — B009 Herpesviral infection, unspecified: Secondary | ICD-10-CM | POA: Diagnosis not present

## 2021-04-12 DIAGNOSIS — J301 Allergic rhinitis due to pollen: Secondary | ICD-10-CM | POA: Diagnosis not present

## 2021-04-12 MED ORDER — VALACYCLOVIR HCL 500 MG PO TABS: 500.0000 mg | ORAL_TABLET | Freq: Every day | ORAL | 3 refills | Status: AC

## 2021-04-12 MED ORDER — OMEPRAZOLE 40 MG PO CPDR: 40.0000 mg | DELAYED_RELEASE_CAPSULE | Freq: Every day | ORAL | 1 refills | Status: DC

## 2021-04-12 NOTE — Progress Notes (Signed)
? ?New Patient Office Visit ? ?Subjective:  ?Patient ID: Mariah Brown, female    DOB: 04/21/1975  Age: 46 y.o. MRN: [id number] ? ?CC:  ?Chief Complaint  ?Patient presents with  ? Establish Care  ?  Need to establish with a PCP ?  ? Fever Blisters  ?  Started about 10 years ago, get them hormonally or when stressed, one every 2 months recently  ? ? ?HPI ?Mariah Brown presents for new pt.  Fever blisters.  ?1. Allergies/occ Asthma-seeing Pulm-on singulair/flonase/xyzal- ?Lichen sclerosis-some better since on singulair-vagina and clobetasol-sees derm.   Dr. Billy Coast gyn ?Cold sores-getting more freq and bad-usu monthly-menses, stress, sun.  ?Covid 02/13/21- still w/raspy voice."Rattle" when talks in throat.  Has "choked" few times.  No heartburn.  ? ? ?Past Medical History:  ?Diagnosis Date  ? Allergy   ? Anxiety   ? Clotting disorder (HCC)   ? PAI-1; on heparin  ? Depression   ? Lichen sclerosus   ? Status post repeat low transverse cesarean section (4/11) 04/23/2013  ? ? ?Past Surgical History:  ?Procedure Laterality Date  ? CESAREAN SECTION  2012  ? CESAREAN SECTION N/A 04/23/2013  ? Procedure: Repeat Cesarean Section Delivery Baby Boy @ 1122, Apgars 9/9;  Surgeon: Lenoard Aden, MD;  Location: [facility];  Service: Obstetrics;  Laterality: N/A;  ? ? ?Family History  ?Problem Relation Age of Onset  ? Hyperlipidemia Mother   ? Depression Mother   ? Alcohol abuse Mother   ? Cancer Mother   ?     colon  ? Mental illness Mother   ? Hypertension Father   ? Heart disease Brother   ? Birth defects Brother   ? Early death Maternal Grandmother   ? Breast cancer Maternal Grandmother 60  ? Alcohol abuse Maternal Grandfather   ? Mental illness Maternal Grandfather   ? Early death Maternal Grandfather   ? Heart attack Maternal Grandfather   ? Heart attack Paternal Grandfather   ? ? ?Social History  ? ?Socioeconomic History  ? Marital status: Married  ?  Spouse name: Not on file  ? Number of children: 2  ? Years of  education: Not on file  ? Highest education level: Not on file  ?Occupational History  ? Not on file  ?Tobacco Use  ? Smoking status: Never  ? Smokeless tobacco: Never  ?Vaping Use  ? Vaping Use: Never used  ?Substance and Sexual Activity  ? Alcohol use: Yes  ?  Alcohol/week: 2.0 standard drinks  ?  Types: 1 Glasses of wine, 1 Cans of beer per week  ? Drug use: Never  ? Sexual activity: Yes  ?  Birth control/protection: Condom, Rhythm  ?Other Topics Concern  ? Not on file  ?Social History Narrative  ? 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 ?Social Determinants of Health  ? ?Financial Resource Strain: Not on file  ?Food Insecurity: Not on file  ?Transportation Needs: Not on file  ?Physical Activity: Not on file  ?Stress: Not on file  ?Social Connections: Not on file  ?Intimate Partner Violence: Not on file  ? ? ?ROS  ?ROS: ?Gen: no fever, chills  ?Skin: no rash, itching ?ENT: HPI ?Eyes: no blurry vision, double vision ?Resp: no cough, wheeze,SOB ?CV: no CP, palpitations, LE edema,  ?GI: no heartburn, n/v/d/c, abd pain ?GU: no dysuria, urgency, frequency, hematuria.  H/o HEELP ?MSK: no joint pain, myalgias, back pain ?Neuro: no dizziness, headache, weakness, vertigo ?Psych: no depression,  anxiety, insomnia, SI  ? ?Objective:  ? ?Today's Vitals: BP 130/70   Pulse 82   Temp 98.3 ?F (36.8 ?C) (Temporal)   Ht 5\' 7"  (1.702 m)   Wt 251 lb 6 oz (114 kg)   LMP 03/29/2021 (Exact Date)   SpO2 97%   Breastfeeding No   BMI 39.37 kg/m?  ? ?Physical Exam  ?Gen: WDWN NAD MOWF ?HEENT: NCAT, conjunctiva not injected, sclera nonicteric ?TM WNL B, OP moist, no exudates    raspy voice ?NECK:  supple, no thyromegaly, no nodes, no carotid bruits ?CARDIAC: RRR, S1S2+, no murmur. DP 2+B ?LUNGS: CTAB. No wheezes ?ABDOMEN:  BS+, soft, NTND, No HSM, no masses ?EXT:  no edema ?MSK: no gross abnormalities.  ?NEURO: A&O x3.  CN II-XII intact.  ?PSYCH: normal mood. Good eye contact  ? ?Assessment & Plan:  ? ?Problem List Items Addressed This  Visit   ? ?  ? Respiratory  ? Allergic rhinitis  ? Asthma  ?  ? Other  ? Herpes simplex virus (HSV) infection  ? Relevant Medications  ? nystatin-triamcinolone ointment (MYCOLOG)  ? Fluconazole (DIFLUCAN PO)  ? valACYclovir (VALTREX) 500 MG tablet  ? ?Other Visit Diagnoses   ? ? Hoarseness    -  Primary  ? ?  ? Hoarseness-post covid-?GERD,inflammation, allergies, other.  Will do PPI for 30-45 days.  If no change, consider ENT ?HSV  - oral.  Freq flares.  Will do suppressive therapy w/Valtrex ?Asthma-mild intermittant-stable.  Occ albuterol.  Sees Pulm ?Allergic rhinits-chronic-stable on meds ? ?Outpatient Encounter Medications as of 04/12/2021  ?Medication Sig  ? albuterol (VENTOLIN HFA) 108 (90 Base) MCG/ACT inhaler Inhale 1-2 puffs into the lungs every 6 (six) hours as needed for wheezing or shortness of breath.  ? BIOTIN PO Take by mouth.  ? Fluconazole (DIFLUCAN PO) Take 200 mg by mouth once a week. 3 tablets once a week  ? fluticasone (FLONASE) 50 MCG/ACT nasal spray Place 1 spray into both nostrils daily. As needed  ? levocetirizine (XYZAL) 5 MG tablet Take 5 mg by mouth every evening.  ? montelukast (SINGULAIR) 10 MG tablet Take 1 tablet (10 mg total) by mouth at bedtime.  ? nystatin-triamcinolone ointment (MYCOLOG) APPLY TO AFFECTED AREA TWICE A DAY  ? omeprazole (PRILOSEC) 40 MG capsule Take 1 capsule (40 mg total) by mouth daily.  ? valACYclovir (VALTREX) 500 MG tablet Take 1 tablet (500 mg total) by mouth daily.  ? ?No facility-administered encounter medications on file as of 04/12/2021.  ? ? ?Follow-up: Return in about 6 months (around 10/12/2021) for cpx.  ? ?10/14/2021, MD ?

## 2021-04-12 NOTE — Patient Instructions (Addendum)
Welcome to [facility]! It was a pleasure meeting you today. ? ?As discussed, Please schedule a 9-12 month follow up visit today. ?L lysine daily.  ? ?PLEASE NOTE: ? ?If you had any LAB tests please let us know if you have not heard back within a few days. You may see your results on MyChart before we have a chance to review them but we will give you a call once they are reviewed by [place]. If we ordered any REFERRALS today, please let us know if you have not heard from their office within the next week.  ?Let us know through MyChart if you are needing REFILLS, or have your pharmacy send [place] the request. You can also use MyChart to communicate with me or any office staff. ? ?Please try these tips to maintain a healthy lifestyle: ? ?Eat most of your calories during the day when you are active. Eliminate processed foods including packaged sweets (pies, cakes, cookies), reduce intake of potatoes, white bread, white pasta, and white rice. Look for whole grain options, oat flour or almond flour. ? ?Each meal should contain half fruits/vegetables, one quarter protein, and one quarter carbs (no bigger than a computer mouse). ? ?Cut down on sweet beverages. This includes juice, soda, and sweet tea. Also watch fruit intake, though this is a healthier sweet option, it still contains natural sugar! Limit to 3 servings daily. ? ?Drink at least 1 glass of water with each meal and aim for at least 8 glasses per day ? ?Exercise at least 150 minutes every week.   ?

## 2021-05-16 ENCOUNTER — Other Ambulatory Visit: Payer: Self-pay | Admitting: Family Medicine

## 2021-05-18 ENCOUNTER — Other Ambulatory Visit: Payer: Self-pay | Admitting: Family Medicine

## 2021-06-25 IMAGING — MG STEREOTACTIC CORE NEEDLE BIOPSY
8 of 11 series · 8 of 19 positions shown · non-contrast
Comparison: Previous exams.
COMPARISON: Previous exams.

Addendum:
CLINICAL DATA: Patient presents for stereotactic core needle biopsy
an area apparent architectural distortion in the left breast.

EXAM:
LEFT BREAST STEREOTACTIC CORE NEEDLE BIOPSY

[L (1 of 8)]
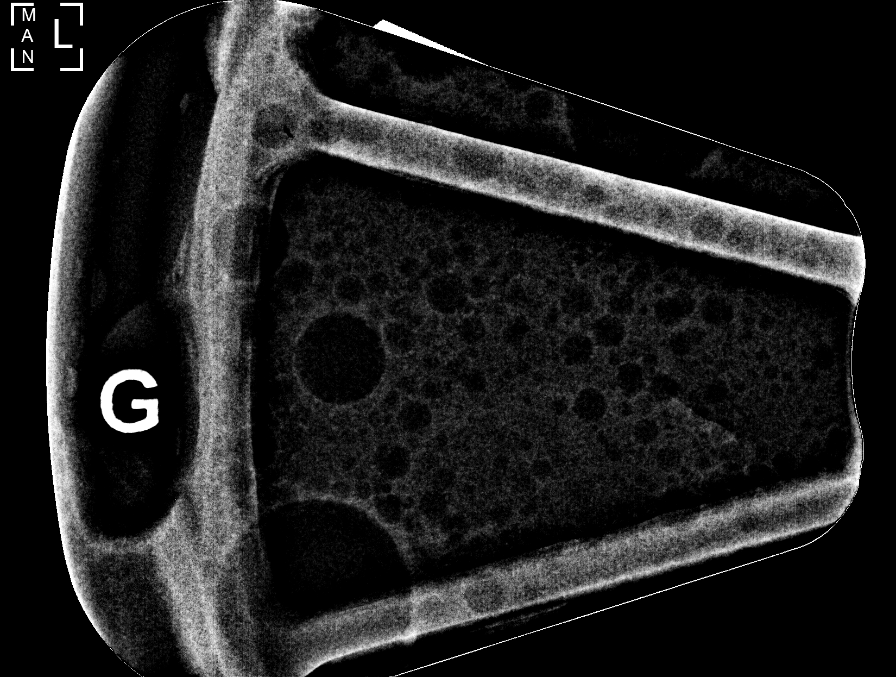

[L (2 of 8)]
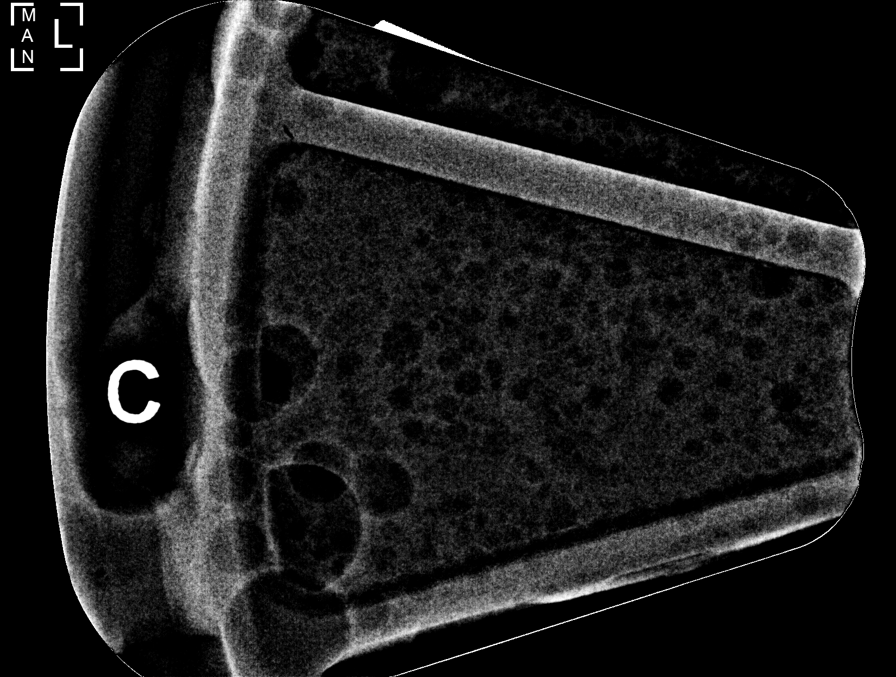

[L (3 of 8)]
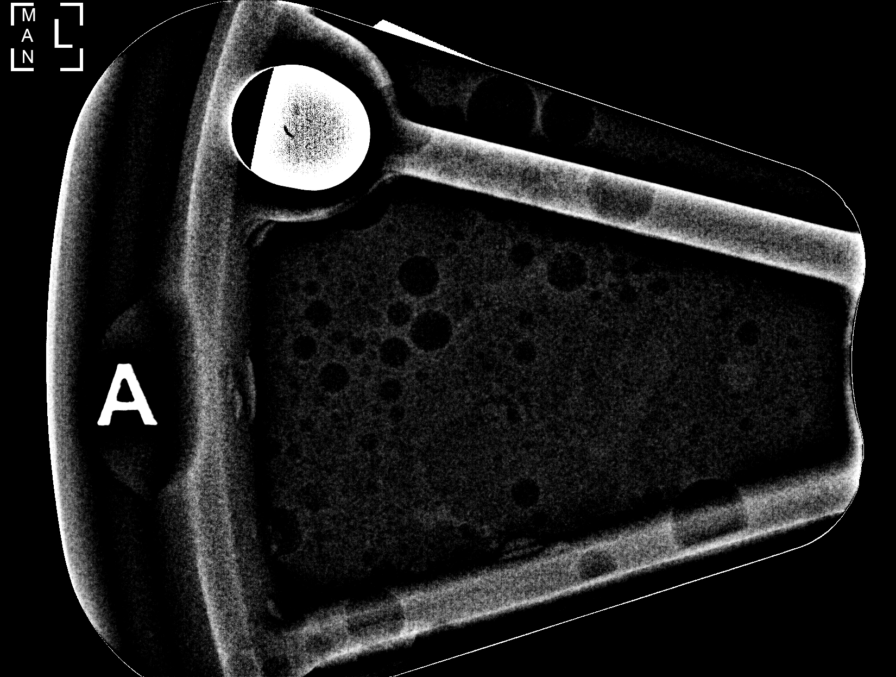

[L (4 of 8)]
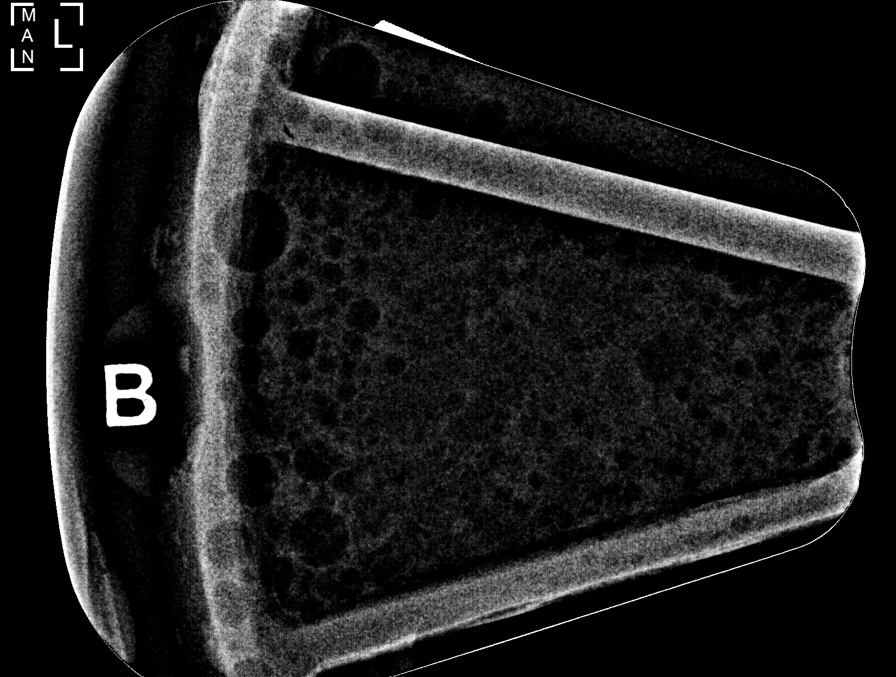

[L (5 of 8)]
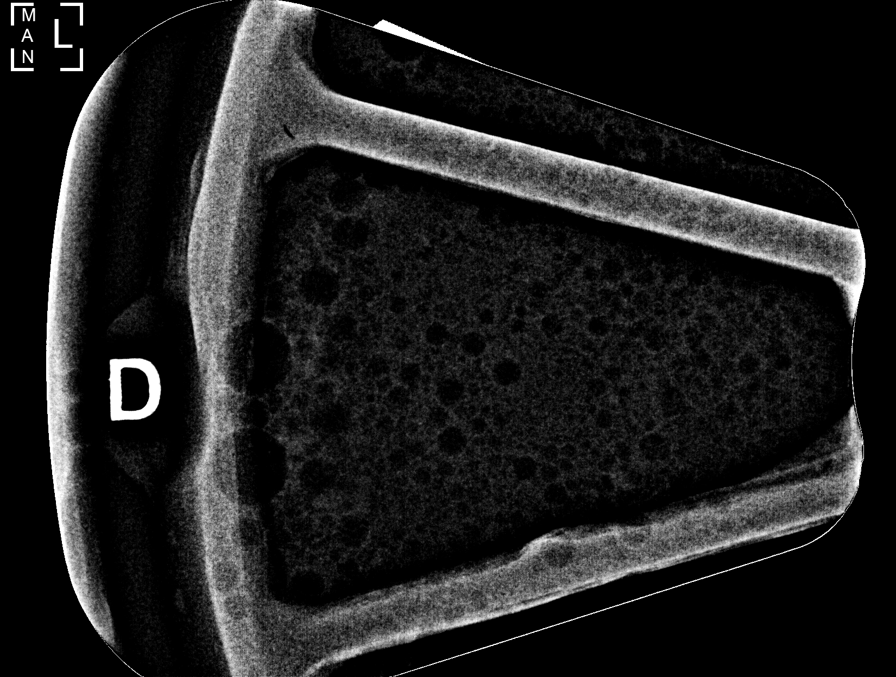

[L (6 of 8)]
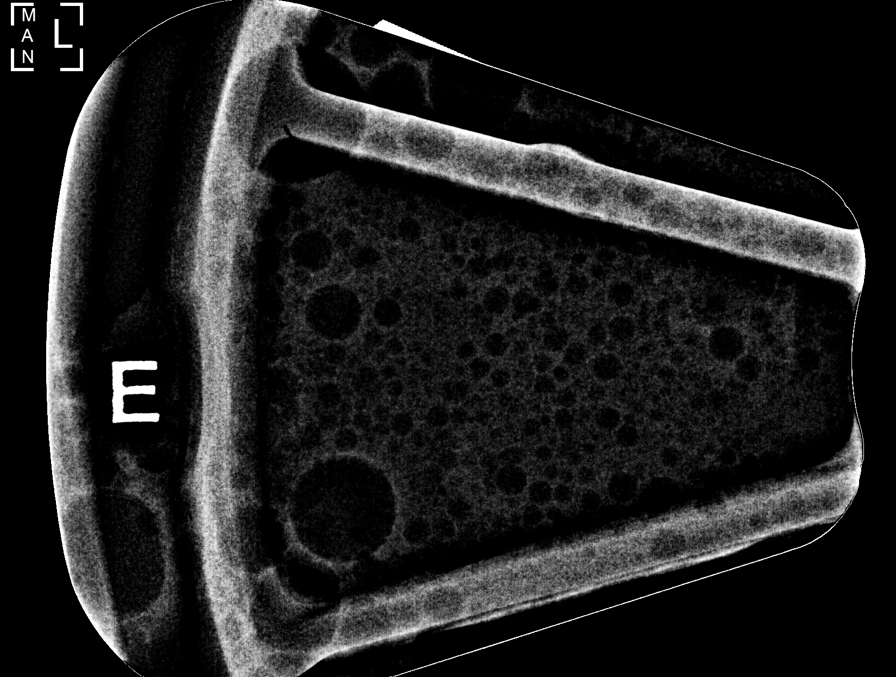

[L (7 of 8)]
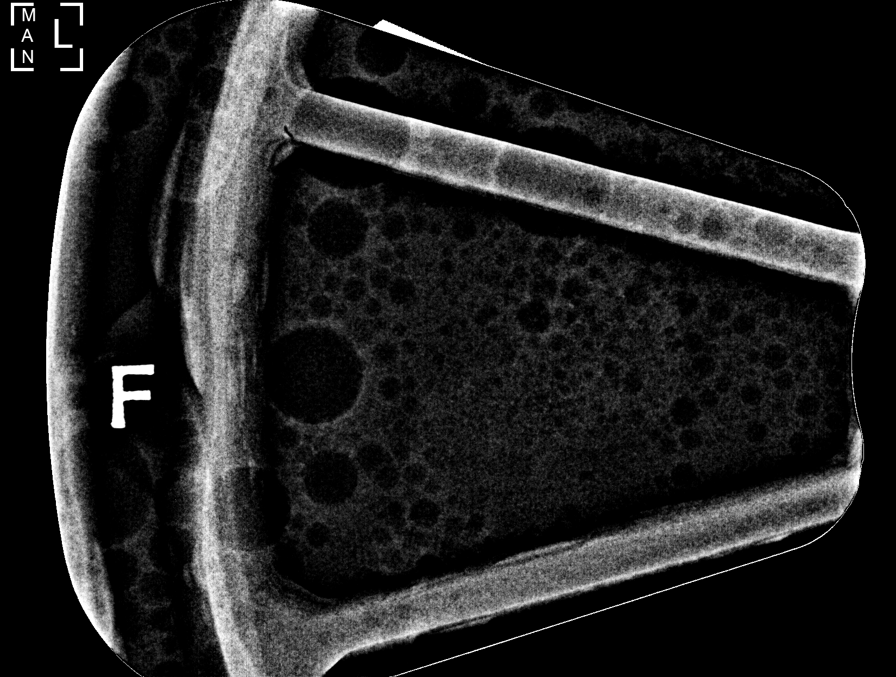

[L (8 of 8)]
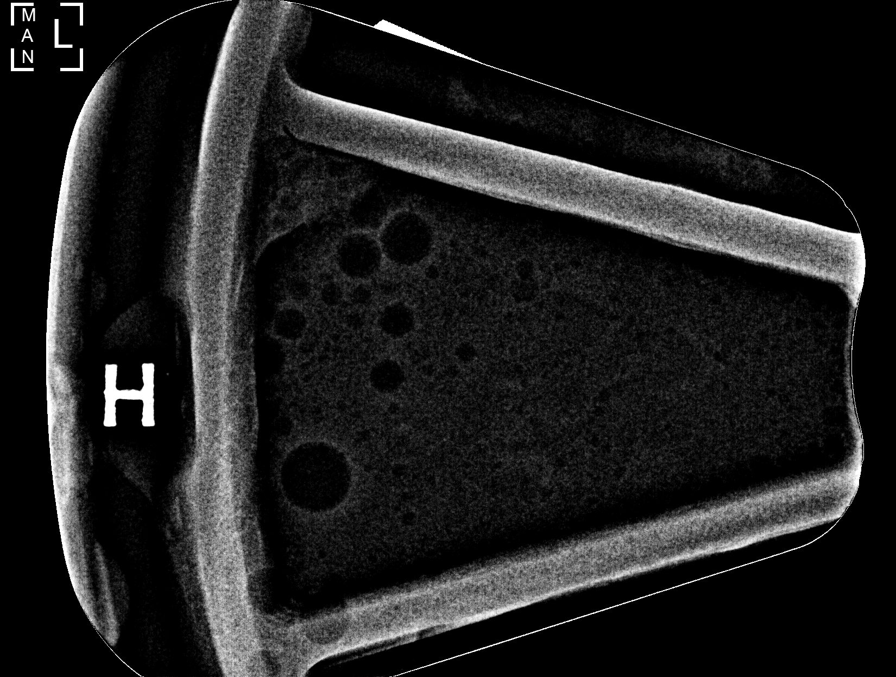

[8 of 19 positions shown; findings below may reference images not displayed]



Using sterile technique and 1% Lidocaine as local anesthetic, under
stereotactic guidance, a 9 gauge vacuum assisted device was used to
perform core needle biopsy of apparent architectural distortion in
the upper quadrant the left breast using a superior approach.

Lesion quadrant: Upper outer quadrant

At the conclusion of the procedure, a coil shaped tissue marker clip
was deployed into the biopsy cavity. Follow-up 2-view mammogram was
performed and dictated separately.
IMPRESSION: Stereotactic-guided biopsy of the left breast. No apparent
complications.

ADDENDUM:
Pathology revealed BENIGN BREAST PARENCHYMA of the LEFT breast,
upper, outer quadrant. This was found to be concordant by Dr. Ricard
Shpresa Al.

Pathology results were discussed with the patient by telephone. The
patient reported doing well after the biopsy with tenderness at the
site. Post biopsy instructions and care were reviewed and questions
were answered. The patient was encouraged to call The [REDACTED]

The patient was instructed to return for annual screening
mammography.

Pathology results reported by Qitiashvili Sukhiashvili, RN on 08/05/2018.



Using sterile technique and 1% Lidocaine as local anesthetic, under
stereotactic guidance, a 9 gauge vacuum assisted device was used to
perform core needle biopsy of apparent architectural distortion in
the upper quadrant the left breast using a superior approach.

Lesion quadrant: Upper outer quadrant

At the conclusion of the procedure, a coil shaped tissue marker clip
was deployed into the biopsy cavity. Follow-up 2-view mammogram was
performed and dictated separately.
IMPRESSION: Stereotactic-guided biopsy of the left breast. No apparent
complications.

## 2021-06-25 IMAGING — MG MM CLIP PLACEMENT
4 series · 4 of 12 positions shown · non-contrast
Comparison: Previous exam(s).

CLINICAL DATA: Status post stereotactic core needle biopsy of a
possible architectural distortion in the left breast.

EXAM:
DIAGNOSTIC LEFT MAMMOGRAM POST STEREOTACTIC BIOPSY

[L ML synth-2D]
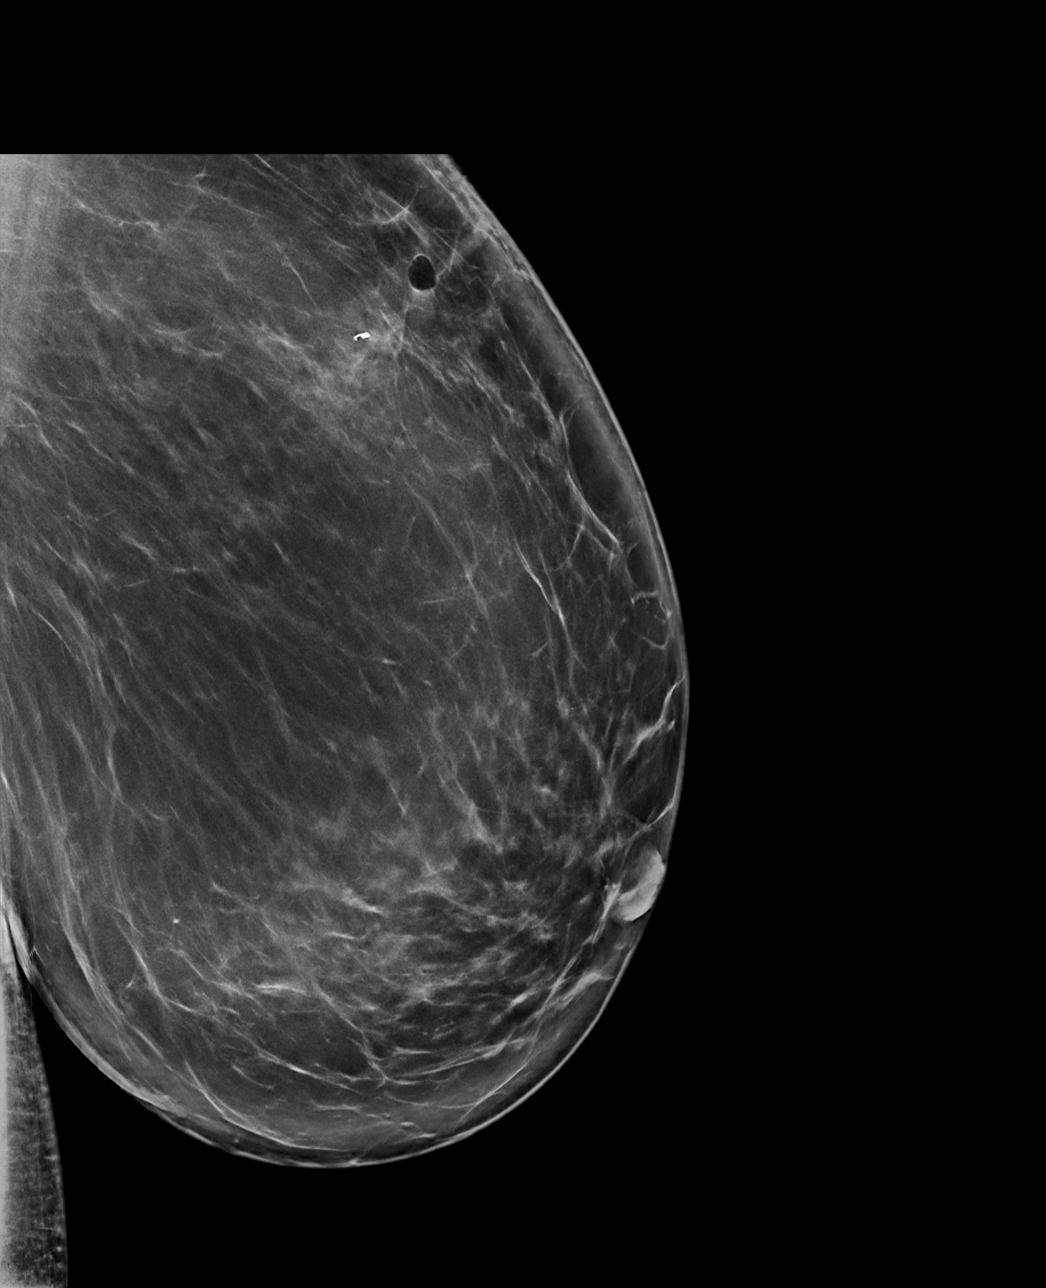

[L CC synth-2D]
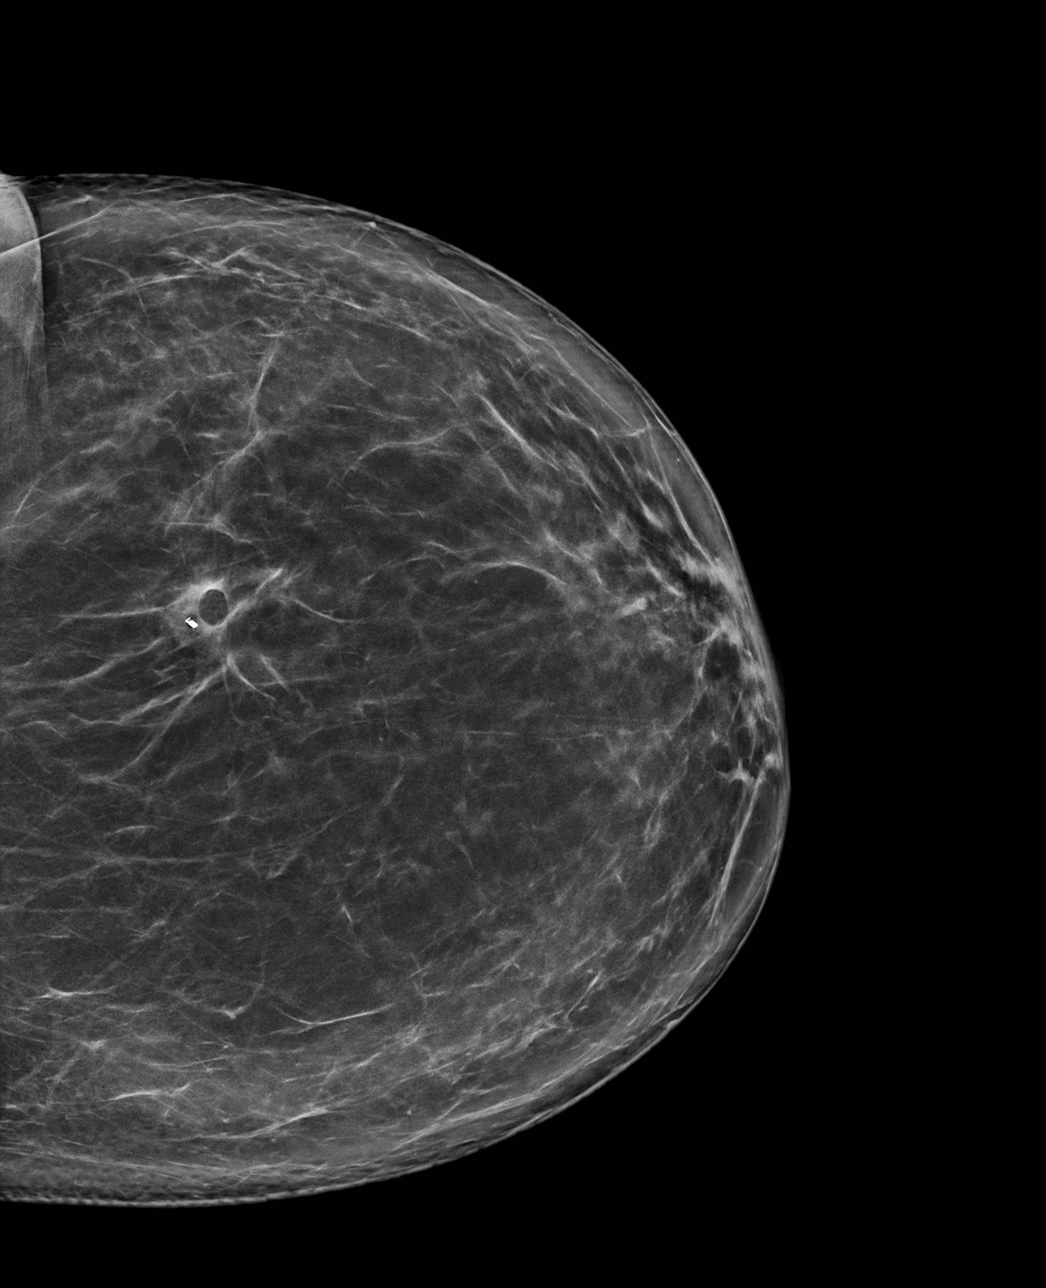

[L CC tomo · tomo slice 50/99.0]
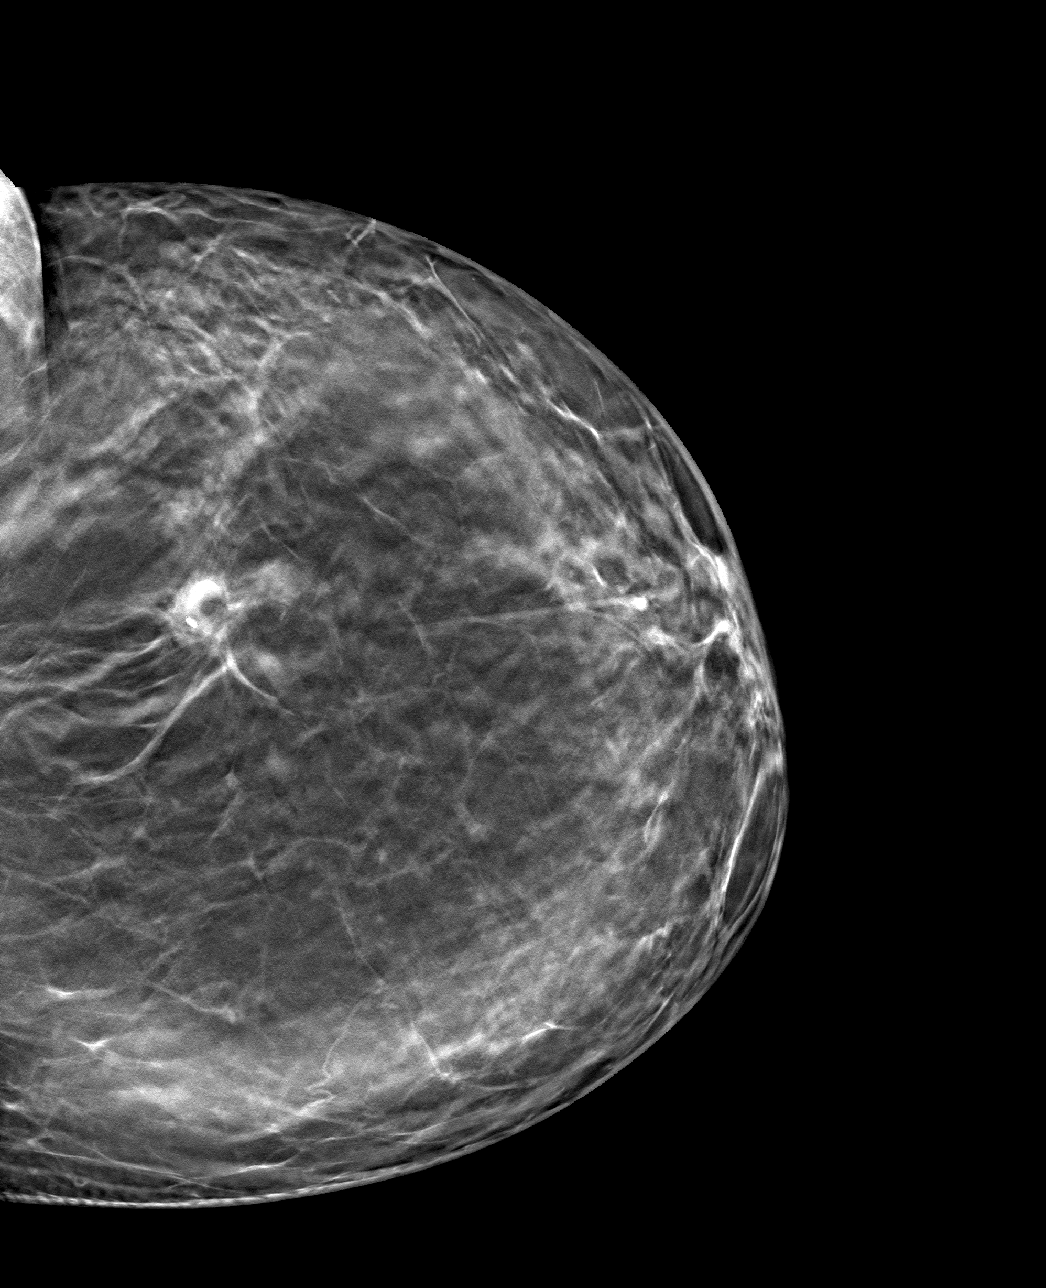

[L ML tomo · tomo slice 59/118.0]
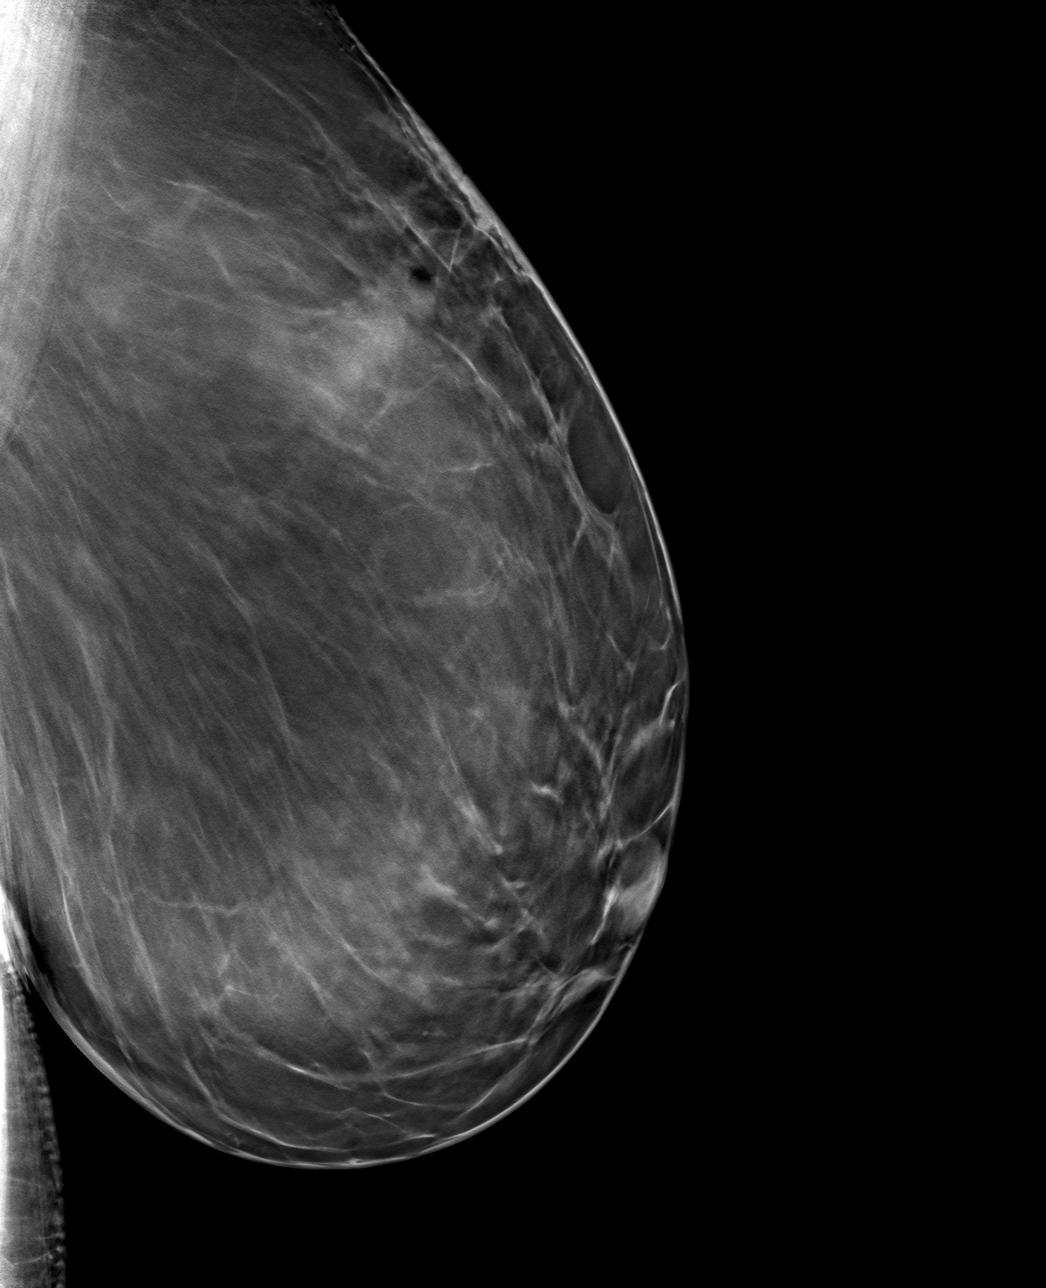

[4 of 12 positions shown; findings below may reference images not displayed]

FINDINGS: Mammographic images were obtained following stereotactic guided
biopsy of a possible architectural distortion in the upper outer
left breast. The coil shaped biopsy clip lies within the area of
possible distortion.
IMPRESSION: Well-positioned coil shaped biopsy clip following stereotactic core
needle biopsy of the left breast.

Final Assessment: Post Procedure Mammograms for Marker Placement

## 2021-08-13 ENCOUNTER — Encounter: Payer: Self-pay | Admitting: Family Medicine

## 2021-08-13 ENCOUNTER — Ambulatory Visit: Payer: BC Managed Care – PPO | Admitting: Family Medicine

## 2021-08-13 VITALS — BP 102/70 | HR 87 | Temp 98.6°F | Ht 67.0 in | Wt 251.0 lb

## 2021-08-13 DIAGNOSIS — E669 Obesity, unspecified: Secondary | ICD-10-CM

## 2021-08-13 MED ORDER — WEGOVY 0.5 MG/0.5ML ~~LOC~~ SOAJ: 0.5000 mg | SUBCUTANEOUS | 0 refills | Status: DC

## 2021-08-13 NOTE — Patient Instructions (Signed)

## 2021-08-13 NOTE — Progress Notes (Signed)
Subjective:     Patient ID: Mariah Brown, female    DOB: Aug 14, 1975, 46 y.o.   MRN: [id number]  Chief Complaint  Patient presents with   Weight Loss    Discuss weight loss and referral to dietician      HPI  Obesity-exercised a lot 2020-2021.  Then trainer inconsistent so not working out past 1 yr.  But, during time of exercise, didn't lose any wt.  Wants to see dietician.  Will join [employer] soon.  Has done [employer] in past-but "starved".  Clothes getting tight again.  No meds ever.  Concerned about rebound w/meds. No thyroid ca or fh MEN.  No h/o pancreatitis Saw Derm at [facility] sclerosis-vaginal-worsened.  On MTX now. Better. Dr. Delanna Ahmadi.did labs  Health Maintenance Due  Topic Date Due   Hepatitis C Screening  Never done   PAP SMEAR-Modifier  05/13/2016   INFLUENZA VACCINE  08/13/2021    Past Medical History:  Diagnosis Date   Allergy    Anxiety    Clotting disorder (HCC)    PAI-1; on heparin   Depression    Lichen sclerosus    Status post repeat low transverse cesarean section (4/11) 04/23/2013    Past Surgical History:  Procedure Laterality Date   CESAREAN SECTION  2012   CESAREAN SECTION N/A 04/23/2013   Procedure: Repeat Cesarean Section Delivery Baby Boy @ 1122, Apgars 9/9;  Surgeon: Lenoard Aden, MD;  Location: [facility];  Service: Obstetrics;  Laterality: N/A;    Outpatient Medications Prior to Visit  Medication Sig Dispense Refill   albuterol (VENTOLIN HFA) 108 (90 Base) MCG/ACT inhaler Inhale 1-2 puffs into the lungs every 6 (six) hours as needed for wheezing or shortness of breath. 8 g 6   augmented betamethasone dipropionate (DIPROLENE-AF) 0.05 % ointment SMARTSIG:sparingly Topical Daily     BIOTIN PO Take by mouth.     fluticasone (FLONASE) 50 MCG/ACT nasal spray Place 1 spray into both nostrils daily. As needed     folic acid (FOLVITE) 1 MG tablet Take 1 tablet by mouth daily.     gabapentin (NEURONTIN) 100 MG capsule Take 100 mg by mouth at bedtime.      levocetirizine (XYZAL) 5 MG tablet Take 5 mg by mouth every evening.     methotrexate (RHEUMATREX) 2.5 MG tablet Take 4 tablets together one day a week after dinner     montelukast (SINGULAIR) 10 MG tablet Take 1 tablet (10 mg total) by mouth at bedtime. 90 tablet 3   nystatin-triamcinolone ointment (MYCOLOG) APPLY TO AFFECTED AREA TWICE A DAY     omeprazole (PRILOSEC) 40 MG capsule TAKE 1 CAPSULE (40 MG TOTAL) BY MOUTH DAILY. 90 capsule 1   valACYclovir (VALTREX) 500 MG tablet Take 1 tablet (500 mg total) by mouth daily. 90 tablet 3   Fluconazole (DIFLUCAN PO) Take 200 mg by mouth once a week. 3 tablets once a week     No facility-administered medications prior to visit.    Allergies  Allergen Reactions   Cephalexin Hives    Pt can take PCN's Pt can take PCN's Pt can take PCN's    Macrobid [Nitrofurantoin]    Nitrofurantoin Macrocrystal Hives   ROS neg/noncontributory except as noted HPI/below      Objective:     BP 102/70   Pulse 87   Temp 98.6 F (37 C) (Temporal)   Ht 5\' 7"  (1.702 m)   Wt 251 lb (113.9 kg)   LMP 07/21/2021 (Exact Date)  SpO2 98%   BMI 39.31 kg/m  Wt Readings from Last 3 Encounters:  08/13/21 251 lb (113.9 kg)  04/12/21 251 lb 6 oz (114 kg)  10/17/20 251 lb (113.9 kg)    Physical Exam   Gen: WDWN NAD HEENT: NCAT, conjunctiva not injected, sclera nonicteric NECK:  supple, no thyromegaly, no nodes, no carotid bruits CARDIAC: RRR, S1S2+, no murmur. DP 2+B LUNGS: CTAB. No wheezes ABDOMEN:  BS+, soft, NTND, No HSM, no masses EXT:  no edema MSK: no gross abnormalities.  NEURO: A&O x3.  CN II-XII intact.  PSYCH: normal mood. Good eye contact    Reviewed labs thru derm Assessment & Plan:   Problem List Items Addressed This Visit   None Visit Diagnoses     Obesity, Class II, BMI 35-39.9, no comorbidity    -  Primary   Relevant Medications   Semaglutide-Weight Management (WEGOVY) 0.5 MG/0.5ML SOAJ   Other Relevant Orders   Amb ref to  Medical Nutrition Therapy-MNT      Obesity-has tried diet/exercise in past-no success.  Frustrated.  Will get back to diet/exercise.  Will try wegovy 0.5mg -SED.  If not approved, then will try phentermine.  Refer nutritionist.  Pt will check ins as well if offered thru ins.  Has appt sch in Sept.   PDMP checked  Meds ordered this encounter  Medications   Semaglutide-Weight Management (WEGOVY) 0.5 MG/0.5ML SOAJ    Sig: Inject 0.5 mg into the skin once a week.    Dispense:  2 mL    Refill:  0    Angelena Sole, MD

## 2021-08-15 ENCOUNTER — Encounter: Payer: BC Managed Care – PPO | Attending: Family Medicine | Admitting: Registered"

## 2021-08-15 ENCOUNTER — Encounter: Payer: Self-pay | Admitting: Registered"

## 2021-08-15 DIAGNOSIS — E669 Obesity, unspecified: Secondary | ICD-10-CM | POA: Insufficient documentation

## 2021-08-15 NOTE — Progress Notes (Signed)
Medical Nutrition Therapy  Appointment Start time:  1127  Appointment End time:  1220  Primary concerns today: Pt referred for wt management.  Referral diagnosis: Obesity.  Preferred learning style: No preference indicated Learning readiness: Ready  NUTRITION ASSESSMENT   Anthropometrics  No wt taken today.   Clinical Medical Hx: preeclampsia, asthma.  Medications: Reviewed. See list.  Labs: N/A Notable Signs/Symptoms: N/A  Lifestyle & Dietary Hx  Pt reports she has been 250 lb for past 7 years. Reports during covid did [employer] 3 days per week and reports burning 700 calories  and increasing done from workouts, but did not lose any wt. Reports hitting 250 lb after having second child. Reports in last couple months clothing has been tight. Feels something needs to change. Reports she stopped with last trainer due to inconsistency and trainer not talking appropriately with kids around. Reports did Weight Watchers about 16 years ago and lost 50 lb but reports felt she was starving self at that time. Reports was doing about 6 main food items and slept often due to tiredness from inadequate intake. Stopped doing that and met husband. Pt reports she struggled with fertility at first but now has 2 children (8 and 11). Reports now that kids are older family is planning to join the [facility] and pt is going to graduate school-wants to do things for herself. Reports husband mentioned changing his meals to hers as well. Reports for kids she packs kids' lunches but not her own. Reports she told her doctor she doesn't want to restrict self from foods-doesn't want to do no carbs, etc because that makes her want to eat them more. Reports her 45 year old is very thin and athletic but 45 year old is built differently more large for age and she also wants to eat in a way that is helpful for him.   Pt reports she usually eats 3 meals per day and 1 snack which is usually something from vending machine or at home may be  peanut butter filled pretzels, 100 calorie cookie packs, yogurt, or nuts. Beverages include: half sweet tea, water x ~30 oz.   Pt works at [employer].   Estimated daily fluid intake: 30 oz water Supplements: biotin, folic acid (due to methotrexate) Sleep: Reports getting around 8 hours in summer, and during school year around 6 hours.  Stress / self-care: N/A Current average weekly physical activity: walks in neighborhood sometimes x 2-3 times per week x 30 minutes. Plans to start at [facility] with family soon.   24-Hr Dietary Recall First Meal: 2 peanut butter toast on whole wheat, coffee with creamer until very light caramel color  Snack: popcorn with Peps[employer] at movies with kids Second Meal 3 PM: quesadilla, half sweet tea *at golf course *usually eats cheese, pepperoni for lunch  Snack: None reported.  Third Meal: 1 taco: soft with beef, avocado, cheese, lettuce, mango salsa, half sweet tea  Snack: None reported.  Beverages: half tea  NUTRITION DIAGNOSIS  NB-1.1 Food and nutrition-related knowledge deficit As related to no prior nutrition education by dietitian.  As evidenced by pt referred to dietitian for nutrition education.   NUTRITION INTERVENTION  Nutrition education (E-1) on the following topics:  Balanced nutrition for chronic disease prevention, wt maintenance/loss, overall health Meal planning Mindful eating   Handouts Provided Include  Balanced plate and food list  Balanced snack sheet  Learning Style & Readiness for Change Teaching method utilized: Visual & Auditory  Demonstrated degree of  understanding via: Teach Back  Barriers to learning/adherence to lifestyle change: None reported.   Goals Established by Pt Goal #1: Plan meals/pack lunches following balanced plate Set out 2-3 evenings for planning/packing meals ahead   Goal #2: Water goal of 64 oz per day  Start with goal of 45 oz water daily then move to 60 oz+   Make physical activity a part of your  week. Try to include at least 30 minutes of physical activity 5 days each week or at least 150 minutes per week. Regular physical activity promotes overall health-including helping to reduce risk for heart disease and diabetes, promoting mental health, and helping [place] sleep better.      MONITORING & EVALUATION Dietary intake, weekly physical activity, in 2 months.  Next Steps   Instructions/Goals:   Goal #1: Plan meals/pack lunches following balanced plate Set out 2-3 evenings for planning/packing meals ahead   Goal #2: Water goal of 64 oz per day  Start with goal of 45 oz water daily then move to 60 oz+   Make physical activity a part of your week. Try to include at least 30 minutes of physical activity 5 days each week or at least 150 minutes per week. Regular physical activity promotes overall health-including helping to reduce risk for heart disease and diabetes, promoting mental health, and helping [place] sleep better.

## 2021-08-15 NOTE — Patient Instructions (Signed)
Instructions/Goals:   Goal #1: Plan meals/pack lunches following balanced plate Set out 2-3 evenings for planning/packing meals ahead   Goal #2: Water goal of 64 oz per day  Start with goal of 45 oz water daily then move to 60 oz+   Make physical activity a part of your week. Try to include at least 30 minutes of physical activity 5 days each week or at least 150 minutes per week. Regular physical activity promotes overall health-including helping to reduce risk for heart disease and diabetes, promoting mental health, and helping [place] sleep better.

## 2021-08-21 LAB — HM MAMMOGRAPHY

## 2021-08-26 ENCOUNTER — Encounter: Payer: Self-pay | Admitting: Family Medicine

## 2021-08-27 ENCOUNTER — Ambulatory Visit: Payer: BC Managed Care – PPO | Admitting: Physician Assistant

## 2021-10-07 ENCOUNTER — Encounter: Payer: Self-pay | Admitting: Family Medicine

## 2021-10-07 ENCOUNTER — Encounter: Payer: Self-pay | Admitting: *Deleted

## 2021-10-07 ENCOUNTER — Ambulatory Visit (INDEPENDENT_AMBULATORY_CARE_PROVIDER_SITE_OTHER): Payer: BC Managed Care – PPO | Admitting: Family Medicine

## 2021-10-07 VITALS — BP 114/81 | HR 104 | Temp 98.4°F | Ht 67.0 in | Wt 252.4 lb

## 2021-10-07 DIAGNOSIS — Z1159 Encounter for screening for other viral diseases: Secondary | ICD-10-CM

## 2021-10-07 DIAGNOSIS — Z114 Encounter for screening for human immunodeficiency virus [HIV]: Secondary | ICD-10-CM | POA: Diagnosis not present

## 2021-10-07 DIAGNOSIS — Z0000 Encounter for general adult medical examination without abnormal findings: Secondary | ICD-10-CM | POA: Diagnosis not present

## 2021-10-07 DIAGNOSIS — Z1211 Encounter for screening for malignant neoplasm of colon: Secondary | ICD-10-CM | POA: Diagnosis not present

## 2021-10-07 DIAGNOSIS — Z23 Encounter for immunization: Secondary | ICD-10-CM

## 2021-10-07 MED ORDER — OMEPRAZOLE 40 MG PO CPDR: 40.0000 mg | DELAYED_RELEASE_CAPSULE | Freq: Two times a day (BID) | ORAL | 1 refills | Status: DC

## 2021-10-07 NOTE — Progress Notes (Signed)
Phone [phone]   Subjective:   Patient is a 46 y.o. female presenting for annual physical.    Chief Complaint  Patient presents with   Annual Exam   Annual exam-mom colon ca dx 5 Obesity-hasn't been able to get wegovy yet.   Bringing lunch.  Saw nutritionist.   See problem oriented charting- ROS- ROS: Gen: no fever, chills  Skin: no rash, itching ENT: no ear pain, ear drainage, nasal congestion, rhinorrhea, sinus pressure, sore throat Eyes: no blurry vision, double vision Resp: no cough, wheeze,SOB CV: no CP,, LE edema,  occ palp.  GI: no, n/v/d/c, abd pain.  Still heartburn at times.  GU: no dysuria, urgency, frequency, hematuria MSK: no joint pain, myalgias, back pain Neuro: no dizziness, headache, weakness, vertigo Psych: no depression, anxiety, insomnia, SI   The following were reviewed and entered/updated in epic: Past Medical History:  Diagnosis Date   Allergy    Anxiety    Clotting disorder (HCC)    PAI-1; on heparin   Depression    Lichen sclerosus    Status post repeat low transverse cesarean section (4/11) 04/23/2013   Patient Active Problem List   Diagnosis Date Noted   Asthma 10/17/2020   Generalized edema 03/21/2016   Status post repeat low transverse cesarean section (4/11) 04/23/2013   Preeclampsia 04/23/2013   Allergic rhinitis 05/31/2009   FOLLICULITIS 05/31/2009   Herpes simplex virus (HSV) infection 07/28/2008   TINEA CORPORIS 07/28/2008   VERTIGO, BENIGN PAROXYSMAL POSITION 08/13/2006   APHTHAE, ORAL 08/13/2006   Past Surgical History:  Procedure Laterality Date   CESAREAN SECTION  2012   CESAREAN SECTION N/A 04/23/2013   Procedure: Repeat Cesarean Section Delivery Baby Boy @ 1122, Apgars 9/9;  Surgeon: Lenoard Aden, MD;  Location: [facility];  Service: Obstetrics;  Laterality: N/A;    Family History  Problem Relation Age of Onset   Hyperlipidemia Mother    Depression Mother    Alcohol abuse Mother    Cancer Mother        colon    Mental illness Mother    Hypertension Father    Heart disease Brother    Birth defects Brother    Early death Maternal Grandmother    Breast cancer Maternal Grandmother 56   Alcohol abuse Maternal Grandfather    Mental illness Maternal Grandfather    Early death Maternal Grandfather    Heart attack Maternal Grandfather    Heart attack Paternal Grandfather     Medications- reviewed and updated Current Outpatient Medications  Medication Sig Dispense Refill   albuterol (VENTOLIN HFA) 108 (90 Base) MCG/ACT inhaler Inhale 1-2 puffs into the lungs every 6 (six) hours as needed for wheezing or shortness of breath. 8 g 6   augmented betamethasone dipropionate (DIPROLENE-AF) 0.05 % ointment SMARTSIG:sparingly Topical Daily     BIOTIN PO Take by mouth.     fluticasone (FLONASE) 50 MCG/ACT nasal spray Place 1 spray into both nostrils daily. As needed     folic acid (FOLVITE) 1 MG tablet Take 1 tablet by mouth daily.     gabapentin (NEURONTIN) 100 MG capsule Take 100 mg by mouth at bedtime.     levocetirizine (XYZAL) 5 MG tablet Take 5 mg by mouth every evening.     methotrexate (RHEUMATREX) 2.5 MG tablet Take 4 tablets together one day a week after dinner     montelukast (SINGULAIR) 10 MG tablet Take 1 tablet (10 mg total) by mouth at bedtime. 90 tablet 3  nystatin-triamcinolone ointment (MYCOLOG) APPLY TO AFFECTED AREA TWICE A DAY     Semaglutide-Weight Management (WEGOVY) 0.5 MG/0.5ML SOAJ Inject 0.5 mg into the skin once a week. 2 mL 0   valACYclovir (VALTREX) 500 MG tablet Take 1 tablet (500 mg total) by mouth daily. 90 tablet 3   omeprazole (PRILOSEC) 40 MG capsule Take 1 capsule (40 mg total) by mouth in the morning and at bedtime. 180 capsule 1   No current facility-administered medications for this visit.    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  HEENT: NCAT, conjunctiva not injected, sclera nonicteric TM WNL B, OP moist, no exudates  NECK:  supple, no thyromegaly, no nodes, no carotid bruits CARDIAC: RRR, S1S2+, no murmur. DP 2+B LUNGS: CTAB. No wheezes ABDOMEN:  BS+, soft, NTND, No HSM, no masses EXT:  no edema MSK: no gross abnormalities. MS 5/5 all 4 NEURO: A&O x3.  CN II-XII intact.  PSYCH: normal mood. Good eye contact    Assessment and Plan   Health Maintenance counseling: 1. Anticipatory guidance: Patient counseled regarding regular dental exams q6 months, eye exams,  avoiding smoking and second hand smoke, limiting alcohol to 1 beverage per day, no illicit drugs.   2. Risk factor reduction:  Advised patient of need for regular exercise and diet rich and fruits and vegetables to reduce risk of heart attack and stroke. Exercise- working on it.  Wt Readings from Last 3 Encounters:  10/07/21 252 lb 6.4 oz (114.5 kg)  08/13/21 251 lb (113.9 kg)  04/12/21 251 lb 6 oz (114 kg)   3. Immunizations/screenings/ancillary studies Immunization History  Administered Date(s) Administered   Influenza,inj,Quad PF,6+ Mos 10/17/2020, 10/07/2021   Influenza-Unspecified 09/14/2018   Moderna Sars-Covid-2 Vaccination 03/12/2019, 04/09/2019, 11/29/2019   Tdap 03/09/2013, 04/27/2013   Tetanus 08/08/2005   There are no preventive care reminders to display for this patient.   4. Cervical cancer screening- 8/9 5. Breast cancer screening-  mammogram 8/9 6. Colon cancer screening - ordered 7. Skin cancer screening- advised regular  sunscreen use. Denies worrisome, changing, or new skin lesions.  8. Birth control/STD check- condom   Problem List Items Addressed This Visit   None Visit Diagnoses     Wellness examination    -  Primary   Relevant Orders   Comprehensive metabolic panel   Hemoglobin A1c   Lipid panel   TSH   CBC with Differential/Platelet   Hepatitis C antibody   HIV Antibody (routine testing w rflx)   Need for immunization against influenza       Relevant Orders   Flu Vaccine QUAD 20mo+IM (Fluarix, Fluzone & Alfiuria Quad PF) (Completed)   Encounter for hepatitis C screening test for low risk patient       Relevant Orders   Hepatitis C antibody   Screening for HIV without presence  of risk factors       Relevant Orders   HIV Antibody (routine testing w rflx)   Screen for colon cancer       Relevant Orders   Ambulatory referral to Gastroenterology      [facility] guidance.  Work on [employer].  Check CBC,CMP,lipids,TSH, A1C.  F/u 1 yr   Recommended follow up: annual No follow-ups on file. Future Appointments  Date Time Provider [place]  11/06/2021  2:00 PM Leota Sauers, RD [place] [facility]    Lab/Order associations:non fasting   ICD-10-CM   1. Wellness examination  Z00.00 Comprehensive metabolic panel    Hemoglobin A1c    Lipid panel    TSH    CBC with Differential/Platelet    Hepatitis C antibody    HIV Antibody (routine testing w rflx)    2. Need for immunization against influenza  Z23 Flu Vaccine QUAD 41mo+IM (Fluarix, Fluzone & Alfiuria Quad PF)    3. Encounter for hepatitis C screening test for low risk patient  Z11.59 Hepatitis C antibody    4. Screening for HIV without presence of risk factors  Z11.4 HIV Antibody (routine testing w rflx)    5. Screen for colon cancer  Z12.11 Ambulatory referral to Gastroenterology      Meds ordered this encounter  Medications   omeprazole (PRILOSEC) 40 MG capsule    Sig: Take 1 capsule (40 mg total) by mouth in the  morning and at bedtime.    Dispense:  180 capsule    Refill:  1    Wellington Hampshire, MD

## 2021-10-07 NOTE — Patient Instructions (Addendum)
It was very nice to see you today!  Call different pharmacies for wegovy    PLEASE NOTE:  If you had any lab tests please let us know if you have not heard back within a few days. You may see your results on MyChart before we have a chance to review them but we will give you a call once they are reviewed by [place]. If we ordered any referrals today, please let us know if you have not heard from their office within the next week.   Please try these tips to maintain a healthy lifestyle:  Eat most of your calories during the day when you are active. Eliminate processed foods including packaged sweets (pies, cakes, cookies), reduce intake of potatoes, white bread, white pasta, and white rice. Look for whole grain options, oat flour or almond flour.  Each meal should contain half fruits/vegetables, one quarter protein, and one quarter carbs (no bigger than a computer mouse).  Cut down on sweet beverages. This includes juice, soda, and sweet tea. Also watch fruit intake, though this is a healthier sweet option, it still contains natural sugar! Limit to 3 servings daily.  Drink at least 1 glass of water with each meal and aim for at least 8 glasses per day  Exercise at least 150 minutes every week.

## 2021-10-08 LAB — CBC WITH DIFFERENTIAL/PLATELET
Basophils Absolute: 0 10*3/uL (ref 0.0–0.1)
Basophils Relative: 0.6 % (ref 0.0–3.0)
Eosinophils Absolute: 0.1 10*3/uL (ref 0.0–0.7)
Eosinophils Relative: 1.8 % (ref 0.0–5.0)
HCT: 35.9 % — ABNORMAL LOW (ref 36.0–46.0)
Hemoglobin: 12.4 g/dL (ref 12.0–15.0)
Lymphocytes Relative: 25.1 % (ref 12.0–46.0)
Lymphs Abs: 1.9 10*3/uL (ref 0.7–4.0)
MCHC: 34.5 g/dL (ref 30.0–36.0)
MCV: 92.6 fl (ref 78.0–100.0)
Monocytes Absolute: 0.4 10*3/uL (ref 0.1–1.0)
Monocytes Relative: 5.6 % (ref 3.0–12.0)
Neutro Abs: 5 10*3/uL (ref 1.4–7.7)
Neutrophils Relative %: 66.9 % (ref 43.0–77.0)
Platelets: 205 10*3/uL (ref 150.0–400.0)
RBC: 3.88 Mil/uL (ref 3.87–5.11)
RDW: 13.4 % (ref 11.5–15.5)
WBC: 7.4 10*3/uL (ref 4.0–10.5)

## 2021-10-08 LAB — COMPREHENSIVE METABOLIC PANEL
ALT: 14 U/L (ref 0–35)
AST: 16 U/L (ref 0–37)
Albumin: 4.1 g/dL (ref 3.5–5.2)
Alkaline Phosphatase: 61 U/L (ref 39–117)
BUN: 16 mg/dL (ref 6–23)
CO2: 24 mEq/L (ref 19–32)
Calcium: 8.9 mg/dL (ref 8.4–10.5)
Chloride: 106 mEq/L (ref 96–112)
Creatinine, Ser: 1.05 mg/dL (ref 0.40–1.20)
GFR: 64.39 mL/min (ref >60.00)
Glucose, Bld: 110 mg/dL — ABNORMAL HIGH (ref 70–99)
Potassium: 3.6 mEq/L (ref 3.5–5.1)
Sodium: 138 mEq/L (ref 135–145)
Total Bilirubin: 0.4 mg/dL (ref 0.2–1.2)
Total Protein: 7.3 g/dL (ref 6.0–8.3)

## 2021-10-08 LAB — LIPID PANEL
Cholesterol: 158 mg/dL (ref 0–200)
HDL: 41.1 mg/dL (ref >39.00)
LDL Cholesterol: 95 mg/dL (ref 0–99)
NonHDL: 116.46
Total CHOL/HDL Ratio: 4
Triglycerides: 106 mg/dL (ref 0.0–149.0)
VLDL: 21.2 mg/dL (ref 0.0–40.0)

## 2021-10-08 LAB — TSH: TSH: 2.2 u[IU]/mL (ref 0.35–5.50)

## 2021-10-08 LAB — HEMOGLOBIN A1C: Hgb A1c MFr Bld: 5.2 % (ref 4.6–6.5)

## 2021-10-08 LAB — HIV ANTIBODY (ROUTINE TESTING W REFLEX): HIV 1&2 Ab, 4th Generation: NONREACTIVE

## 2021-10-08 LAB — HEPATITIS C ANTIBODY: Hepatitis C Ab: NONREACTIVE

## 2021-10-08 NOTE — Progress Notes (Signed)
Labs great except  Sugar a little elevated-if not fasting-makes sense.  If fasting-continue working on diet(A1C was normal)

## 2021-10-26 ENCOUNTER — Other Ambulatory Visit: Payer: Self-pay | Admitting: Adult Health

## 2021-11-05 ENCOUNTER — Encounter: Payer: Self-pay | Admitting: Family Medicine

## 2021-11-06 ENCOUNTER — Encounter: Payer: BC Managed Care – PPO | Admitting: Registered"

## 2021-12-24 ENCOUNTER — Telehealth: Payer: Self-pay | Admitting: Family Medicine

## 2021-12-24 NOTE — Telephone Encounter (Signed)
After speaking with nurse pt states she was informed that she needs to be seen within 24 hours. I began explaining that although Dr. Ruthine Dose isn't available within that time window, but pt stated she does not want to see anyone else. States she will go somewhere else.

## 2021-12-24 NOTE — Telephone Encounter (Signed)
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 When she coughs she has a hard time breathing . Translation No Nurse Assessment Nurse: Annye English, RN, Denise Date/Time (Eastern Time): 12/24/2021 2:11:25 PM Confirm and document reason for call. If symptomatic, describe symptoms. ---Caller states a pt has been experiencing a chronic cough even after medication . When she coughs she has a hard time breathing. Does the patient have any new or worsening symptoms? ---Yes Will a triage be completed? ---Yes Related visit to physician within the last 2 weeks? ---No Does the PT have any chronic conditions? (i.e. diabetes, asthma, this includes High risk factors for pregnancy, etc.) ---Yes List chronic conditions. ---Likensclerosis Is the patient pregnant or possibly pregnant? (Ask all females between the ages of 106-55) ---No Is this a behavioral health or substance abuse call? ---No Guidelines Guideline Title Affirmed Question Affirmed Notes Nurse Date/Time (Eastern Time) Cough - Acute NonProductive [1] Continuous (nonstop) coughing interferes with work or school AND [2] no improvement using Annye English, RN, Angelique Blonder 12/24/2021 2:12:41 PM  Guidelines Guideline Title Affirmed Question Affirmed Notes Nurse Date/Time (Eastern Time) cough treatment  per Care Advice Disp. Time Lamount Cohen Time) Disposition Final User 12/24/2021 2:08:15 PM Send to Urgent Linna Darner 12/24/2021 2:15:32 PM See PCP within 24 Hours Yes Carmon, RN, Angelique Blonder Final Disposition 12/24/2021 2:15:32 PM See PCP within 24 Hours Yes Carmon, RN, Leighton Ruff Disagree/Comply Comply Caller Understands Yes PreDisposition Call Doctor Care Advice Given Per Guideline SEE PCP WITHIN 24 HOURS: COUGH MEDICINES: * COUGH DROPS: Over-the-counter cough drops can help a lot, especially for mild coughs. They soothe an irritated throat and remove the tickle sensation in the back of the throat. Cough drops are easy to carry with you. * COUGH SYRUP WITH DEXTROMETHORPHAN: An over-the-counter cough syrup can help your cough. The most common cough suppressant in over-the-counter cough medicines is dextromethorphan. * HOME REMEDY - HONEY: This old home remedy has been shown to help decrease coughing at night. The adult dosage is 2 teaspoons (10 ml) at bedtime. HUMIDIFIER: * If the air is dry, use a humidifier in the bedroom. AVOID TOBACCO SMOKE: * Avoid smoke from tobacco and e-cigarettes. CALL BACK IF: * You become worse CARE ADVICE given per Cough - Acute Non-Productive (Adult) guideline. Referrals REFERRED TO PCP OFFICE

## 2021-12-24 NOTE — Telephone Encounter (Signed)
Patient states: - She has been experiencing a chronic cough that is worsening  - She has tried taking many medications including almost a whole bottle of nyquil - Cough has been keeping her and family up at night  - Has hard time breathing when coughing

## 2021-12-24 NOTE — Telephone Encounter (Signed)
Please see message below

## 2022-01-05 ENCOUNTER — Other Ambulatory Visit: Payer: Self-pay | Admitting: Adult Health

## 2022-01-25 ENCOUNTER — Other Ambulatory Visit: Payer: Self-pay | Admitting: Adult Health

## 2022-02-11 ENCOUNTER — Encounter: Payer: Self-pay | Admitting: Adult Health

## 2022-02-11 ENCOUNTER — Ambulatory Visit: Payer: BC Managed Care – PPO | Admitting: Adult Health

## 2022-02-11 ENCOUNTER — Ambulatory Visit (INDEPENDENT_AMBULATORY_CARE_PROVIDER_SITE_OTHER): Payer: BC Managed Care – PPO

## 2022-02-11 VITALS — BP 126/74 | HR 89 | Temp 98.3°F | Ht 67.0 in | Wt 254.2 lb

## 2022-02-11 DIAGNOSIS — J4521 Mild intermittent asthma with (acute) exacerbation: Secondary | ICD-10-CM

## 2022-02-11 DIAGNOSIS — R490 Dysphonia: Secondary | ICD-10-CM

## 2022-02-11 DIAGNOSIS — R059 Cough, unspecified: Secondary | ICD-10-CM | POA: Diagnosis not present

## 2022-02-11 MED ORDER — BENZONATATE 200 MG PO CAPS: 200.0000 mg | ORAL_CAPSULE | Freq: Three times a day (TID) | ORAL | 1 refills | Status: AC | PRN

## 2022-02-11 MED ORDER — LEVOCETIRIZINE DIHYDROCHLORIDE 5 MG PO TABS: 5.0000 mg | ORAL_TABLET | Freq: Every evening | ORAL | 3 refills | Status: DC

## 2022-02-11 MED ORDER — PREDNISONE 20 MG PO TABS: 20.0000 mg | ORAL_TABLET | Freq: Every day | ORAL | 0 refills | Status: DC

## 2022-02-11 NOTE — Assessment & Plan Note (Signed)
Chronic hoarseness over the last year this been intermittent.  Patient has significant chronic cough.  Will treat for cough control.  Advise if symptoms do not improve will need referral to ENT.  Advised on voice rest and avoiding seeing him for the next couple weeks. Patient will call back if hoarseness does not resolve.  She will need a referral to ENT if continues

## 2022-02-11 NOTE — Progress Notes (Signed)
@Patient  ID: Mariah Brown, female    DOB: February 02, 1975, 47 y.o.   MRN: [id number]  Chief Complaint  Patient presents with   Acute Visit    Referring provider: Tawnya Crook, MD  HPI: 47 year old female never smoker seen for pulmonary consult October 17, 2020 for cough and shortness of breath Medical history significant for allergies and hayfever Patient is a Pharmacist, hospital -[employer]  TEST/EVENTS :  Labs October 17, 2020 IgE 39, eosinophils absolute count 200 October fifth 2022 exhaled nitric oxide testing 18 ppb  Spirometry October 2022 normal   02/11/2022 Acute OV : Cough  Patient presents for an acute office visit.  Patient was seen last visit on October 17, 2020 for pulmonary consult.  She had had some ongoing cough and shortness of breath.  Patient was suspected to have underlying mild intermittent asthma and allergic rhinitis.  She was started on Singulair 10 mg daily.  Recommend to use albuterol and continued on trigger prevention with Xyzal and Flonase.  Patient says she has done exceptionally well.  Singulair made a huge difference in her daily symptoms.  Says her cough went away.  Unfortunately patient had COVID-19 infection in February 2023.  Patient says she had a severe cough for several weeks.  This finally improved some.  However she continued to have ongoing coughing paroxysms and intermittent hoarseness and raspy voice.  She complains about 6 weeks ago several members in her family got sick with an upper respiratory infection.  She did test for COVID-19 but was negative.  However says that she has had a ongoing cough over the last 6 weeks.  Cough is minimally productive.  No fever, chest pain, orthopnea, discolored mucus.  Patient does have lichen sclerosis -was started on methotrexate in 2023.  Which is made a huge difference in her symptoms.  Patient denies any hemoptysis, fever, chest pain, orthopnea.  Patient does complain of nasal congestion and sinus fullness.   She denies any teeth pain or discolored nasal discharge.  Patient does complain that her cough can be severe and it does cause stress incontinence with coughing.  Does have some intermittent wheezing. 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 Patient does like to sing.   Allergies  Allergen Reactions   Cephalexin Hives    Pt can take PCN's Pt can take PCN's Pt can take PCN's    Macrobid [Nitrofurantoin]    Nitrofurantoin Macrocrystal Hives    Immunization History  Administered Date(s) Administered   Influenza,inj,Quad PF,6+ Mos 10/17/2020, 10/07/2021   Influenza-Unspecified 09/14/2018   Moderna Sars-Covid-2 Vaccination 03/12/2019, 04/09/2019, 11/29/2019   Tdap 03/09/2013, 04/27/2013   Tetanus 08/08/2005    Past Medical History:  Diagnosis Date   Allergy    Anxiety    Clotting disorder (HCC)    PAI-1; on heparin   Depression    Lichen sclerosus    Status post repeat low transverse cesarean section (4/11) 04/23/2013    Tobacco History: Social History   Tobacco Use  Smoking Status Never  Smokeless Tobacco Never   Counseling given: Not Answered   Outpatient Medications Prior to Visit  Medication Sig Dispense Refill   albuterol (VENTOLIN HFA) 108 (90 Base) MCG/ACT inhaler INHALE 1-2 PUFFS BY MOUTH EVERY 6 HOURS AS NEEDED FOR WHEEZE OR SHORTNESS OF BREATH 8.5 each 6   augmented betamethasone dipropionate (DIPROLENE-AF) 0.05 % ointment SMARTSIG:sparingly Topical Daily     fluticasone (FLONASE) 50 MCG/ACT nasal spray Place 1 spray into both nostrils daily.  As needed     folic acid (FOLVITE) 1 MG tablet Take 1 tablet by mouth daily.     gabapentin (NEURONTIN) 100 MG capsule Take 200 mg by mouth at bedtime.     methotrexate (RHEUMATREX) 2.5 MG tablet Take 5 tablets by mouth once a week.     montelukast (SINGULAIR) 10 MG tablet TAKE 1 TABLET BY MOUTH EVERYDAY AT BEDTIME 90 tablet 3   valACYclovir (VALTREX) 500 MG tablet Take 1 tablet (500 mg total) by  mouth daily. 90 tablet 3   levocetirizine (XYZAL) 5 MG tablet Take 5 mg by mouth every evening.     BIOTIN PO Take by mouth. (Patient not taking: Reported on 02/11/2022)     omeprazole (PRILOSEC) 40 MG capsule Take 1 capsule (40 mg total) by mouth in the morning and at bedtime. (Patient not taking: Reported on 02/11/2022) 180 capsule 1   nystatin-triamcinolone ointment (MYCOLOG) APPLY TO AFFECTED AREA TWICE A DAY     Semaglutide-Weight Management (WEGOVY) 0.5 MG/0.5ML SOAJ Inject 0.5 mg into the skin once a week. 2 mL 0   No facility-administered medications prior to visit.     Review of Systems:   Constitutional:   No  weight loss, night sweats,  Fevers, chills, fatigue, or  lassitude.  HEENT:   No headaches,  Difficulty swallowing,  Tooth/dental problems, or  Sore throat,                No sneezing, itching, ear ache,  +nasal congestion, post nasal drip,   CV:  No chest pain,  Orthopnea, PND, swelling in lower extremities, anasarca, dizziness, palpitations, syncope.   GI  No heartburn, indigestion, abdominal pain, nausea, vomiting, diarrhea, change in bowel habits, loss of appetite, bloody stools.   Resp:   No chest wall deformity  Skin: no rash or lesions.  GU: no dysuria, change in color of urine, no urgency or frequency.  No flank pain, no hematuria   MS:  No joint pain or swelling.  No decreased range of motion.  No back pain.    Physical Exam  BP 126/74 (BP Location: Left Arm, Patient Position: Sitting, Cuff Size: Large)   Pulse 89   Temp 98.3 F (36.8 C) (Oral)   Ht 5\' 7"  (1.702 m)   Wt 254 lb 3.2 oz (115.3 kg)   SpO2 95% Comment: RA  BMI 39.81 kg/m   GEN: A/Ox3; pleasant , NAD, well nourished    HEENT:  [place]/AT,  EACs-clear, TMs-wnl, NOSE-clear, THROAT-clear, no lesions, no postnasal drip or exudate noted.   NECK:  Supple w/ fair ROM; no JVD; normal carotid impulses w/o bruits; no thyromegaly or nodules palpated; no lymphadenopathy.    RESP  Clear  P & A; w/o,  faint expiratory wheeze on forced expiration no accessory muscle use, no dullness to percussion speaks in full sentences  CARD:  RRR, no m/r/g, no peripheral edema, pulses intact, no cyanosis or clubbing.  GI:   Soft & nt; nml bowel sounds; no organomegaly or masses detected.   Musco: Warm bil, no deformities or joint swelling noted.   Neuro: alert, no focal deficits noted.    Skin: Warm, no lesions or rashes    Lab Results:    BMET   BNP No results found for: "BNP"  ProBNP No results found for: "PROBNP"  Imaging: DG Chest 2 View  Result Date: 02/11/2022 CLINICAL DATA:  Cough for 4 weeks.  On methotrexate. EXAM: CHEST - 2 VIEW COMPARISON:  Chest two  views 10/17/2020 FINDINGS: Cardiac silhouette and mediastinal contours are within normal limits. The lungs are clear. No pleural effusion or pneumothorax. No acute skeletal abnormality. IMPRESSION: No active cardiopulmonary disease. Electronically Signed   By: Yvonne Kendall M.D.   On: 02/11/2022 15:30          No data to display          Lab Results  Component Value Date   NITRICOXIDE 18 10/17/2020        Assessment & Plan:   No problem-specific Assessment & Plan notes found for this encounter.     Rexene Edison, NP 02/11/2022

## 2022-02-11 NOTE — Assessment & Plan Note (Signed)
Exacerbation with recent upper respiratory infection Check chest x-ray today.  Hold on antibiotics at this time.  Continue with cough control regimen and trigger prevention  Plan  Patient Instructions  Chest xray today .  Prednisone 20mg  daily for 5 days  Delsym 2 tsp Twice daily  for cough , as needed  Tessalon Three times a day  for cough , as needed  Continue on Xyzal 5mg  daily  Continue on Flonase daily  Saline spray Twice daily   Saline nasal gel At bedtime   Albuterol inhaler 1-2 puffs every 6hr as needed  Singulair 10mg  At bedtime   Call back if hoarseness does not resolve, will need ENT referral .  Follow up with Dr. Erin Fulling  or Demoni Parmar NP in 3 months and As needed   Please contact office for sooner follow up if symptoms do not improve or worsen or seek emergency care

## 2022-02-11 NOTE — Patient Instructions (Addendum)
Chest xray today .  Prednisone 20mg  daily for 5 days  Delsym 2 tsp Twice daily  for cough , as needed  Tessalon Three times a day  for cough , as needed  Continue on Xyzal 5mg  daily  Continue on Flonase daily  Saline spray Twice daily   Saline nasal gel At bedtime   Albuterol inhaler 1-2 puffs every 6hr as needed  Singulair 10mg  At bedtime   Call back if hoarseness does not resolve, will need ENT referral .  Follow up with Dr. Erin Fulling  or Lahari Suttles NP in 3 months and As needed   Please contact office for sooner follow up if symptoms do not improve or worsen or seek emergency care

## 2022-02-15 DIAGNOSIS — I499 Cardiac arrhythmia, unspecified: Secondary | ICD-10-CM

## 2022-02-17 NOTE — Telephone Encounter (Signed)
Oh no, we definitely need to have her come in and assess for  AFib  Can double book my schedule this week Stop Delsym.  Also prednisone can contribute as well. Make sure she has no chest pain, dizziness/presyncopal sx , if so ER .  Will need EKG .   Please contact office for sooner follow up if symptoms do not improve or worsen or seek emergency care

## 2022-02-17 NOTE — Telephone Encounter (Signed)
Mychart message sent by pt: Mariah Brown [facility] (supporting Tammy S Parrett, NP)2 days ago    Soooo. Took Delsym last night along with the pearls and started feeling funny. I had taken Wednesday and Thursday night without any issues. My heart started fluttering so I used my iPhone and watch to check my ECG and it said "AFib". I did it several times and after about an hour it seemed to go away, thankfully. I did take some aspirin since I could be prone to clotting. I haven't taken any more and won't. I will say I have not had issues with heart flutters until I got Covid (in February 2023). I had them about a month or two after having Covid. Not regularly though.   I finish the prednisone tomorrow.    Not sure if I should be super worried since it seems connected to the Delsym.   Also-I saw you at [facility] Friday. :0) Our group had taken over the bar! (Also I did not drink Friday night)    Routing to Tammy for review. Pt also posted data in review media section of message to show what happened when she checked her phone.

## 2022-02-18 ENCOUNTER — Other Ambulatory Visit: Payer: Self-pay | Admitting: Adult Health

## 2022-02-18 ENCOUNTER — Encounter: Payer: Self-pay | Admitting: Adult Health

## 2022-02-18 ENCOUNTER — Other Ambulatory Visit: Payer: Self-pay | Admitting: *Deleted

## 2022-02-18 ENCOUNTER — Ambulatory Visit (INDEPENDENT_AMBULATORY_CARE_PROVIDER_SITE_OTHER): Payer: BC Managed Care – PPO | Admitting: Adult Health

## 2022-02-18 VITALS — BP 110/70 | HR 103 | Temp 98.1°F | Ht 67.0 in | Wt 254.8 lb

## 2022-02-18 DIAGNOSIS — I4891 Unspecified atrial fibrillation: Secondary | ICD-10-CM | POA: Diagnosis not present

## 2022-02-18 DIAGNOSIS — J301 Allergic rhinitis due to pollen: Secondary | ICD-10-CM

## 2022-02-18 DIAGNOSIS — J4530 Mild persistent asthma, uncomplicated: Secondary | ICD-10-CM

## 2022-02-18 DIAGNOSIS — I480 Paroxysmal atrial fibrillation: Secondary | ICD-10-CM

## 2022-02-18 MED ORDER — ARNUITY ELLIPTA 100 MCG/ACT IN AEPB: 1.0000 | INHALATION_SPRAY | Freq: Every day | RESPIRATORY_TRACT | 5 refills | Status: DC

## 2022-02-18 MED ORDER — GUAIFENESIN-CODEINE 100-10 MG/5ML PO SYRP: 5.0000 mL | ORAL_SOLUTION | Freq: Three times a day (TID) | ORAL | 0 refills | Status: DC | PRN

## 2022-02-18 NOTE — Assessment & Plan Note (Signed)
Brief episode of irregular heart beat and palpitations.  Apple Watch did show episode of brief episode of A-fib. Unclear if this is related to Delsym cough syrup or not.  She also was on prednisone and albuterol during acute illness. We discussed avoiding Delsym at this time.  Avoid decongestants. Recent TSH was normal.  Patient has a strong family history of coronary disease. She does have some intermittent snoring.  But no significant sleep issues.  If workup reveals underlying A-fib would consider a sleep study to rule out underlying sleep apnea  Plan  Patient Instructions  Begin Arnuity 1 puff daily, rinse after use.  Tessalon Three times a day  for cough , as needed  Codeine cough syrup As needed, may make you sleepy  Continue on Xyzal 5mg  daily  Continue on Flonase daily  Saline spray Twice daily   Saline nasal gel At bedtime   Albuterol inhaler 1-2 puffs every 6hr as needed  Singulair 10mg  At bedtime   Refer to Cardiology.  Call back if hoarseness does not resolve, will need ENT referral .  Follow up with Dr. Erin Fulling  or Elley Harp NP in 6-8 weeks and As needed   Please contact office for sooner follow up if symptoms do not improve or worsen or seek emergency care

## 2022-02-18 NOTE — Assessment & Plan Note (Signed)
Slow to resolve asthmatic flare after recent URI with postviral cough.  Will begin Arnuity daily  Continue to control for triggers.  Asthma action plan discussed.  Albuterol as needed  Plan  Patient Instructions  Begin Arnuity 1 puff daily, rinse after use.  Tessalon Three times a day  for cough , as needed  Codeine cough syrup As needed, may make you sleepy  Continue on Xyzal 5mg  daily  Continue on Flonase daily  Saline spray Twice daily   Saline nasal gel At bedtime   Albuterol inhaler 1-2 puffs every 6hr as needed  Singulair 10mg  At bedtime   Refer to Cardiology.  Call back if hoarseness does not resolve, will need ENT referral .  Follow up with Dr. Erin Fulling  or Aleksia Freiman NP in 6-8 weeks and As needed   Please contact office for sooner follow up if symptoms do not improve or worsen or seek emergency care

## 2022-02-18 NOTE — Progress Notes (Signed)
@Patient  ID: Mariah Brown, female    DOB: December 15, 1975, 47 y.o.   MRN: [id number]  Chief Complaint  Patient presents with   Acute Visit    Referring provider: Tawnya Crook, MD  HPI: 47 year old female never smoker seen for pulmonary consult October 17, 2020 for cough and shortness of breath consistent with Asthma  Medical history significant for seasonal allergies Patient is a Pharmacist, hospital at [employer]  TEST/EVENTS :  Labs October 17, 2020 IgE 39, eosinophils absolute count 200 October fifth 2022 exhaled nitric oxide testing 18 ppb  Spirometry October 2022 normal    02/18/2022 Acute OV : A fib  Patient presents for an acute work in visit.  Patient says she had an episode 4 days ago after taking Delsym.  She says she took Delsym for cough and then within 10 minutes felt a little lightheaded dizzy.  Her Apple Watch said that her heart rate was elevated and irregular with atrial fibrillation.  Heart rate was around 115-120.  Patient says episode lasted about 15 minutes.  And resolved and her next heart rate check on Apple Watch showed normal rhythm with heart rate around 78-90.  Patient has no history of known heart issues.  Patient says she did have a similar episode about a year ago after she had COVID-19.  Patient denies any visual changes speech changes arm weakness chest pain or syncope.  She does have some mild intermittent snoring.  No known history of sleep apnea.  She says she feels like she sleeps well and does not have any significant daytime sleepiness.  Patient does have a strong family history of heart disease she has had 2 grandfathers passed away from heart attacks.  Her brother has had open heart surgery as a child for transposition of great vessels and has a pacemaker. EKG today shows sinus rhythm with a heart rate at 85 bpm with nonspecific T wave abnormality.  And poor R wave progression.  Patient is followed for mild intermittent asthma and allergic rhinitis.   She was initially seen for consult in October 2022 for cough and shortness of breath.  She was treated with Xyzal and Flonase along with daily Singulair.  Patient says she had a significant improvement in symptoms with resolution of cough.  Patient says she had COVID-19 infection in February 2023.  She had a prolonged cough after this that took weeks to get better.  Complains that her and her family developed a upper respiratory infection over Christmas holidays 2023.  She tested for COVID-19 but was negative.  But says she had an ongoing cough that is lasted over the last 6 weeks.  Last visit patient was to use Delsym and Tessalon pearls for cough control.  Continue on Xyzal and Flonase and Singulair. Chest x-ray was clear.  Patient says that she is feeling some better with decreased cough and congestion.  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  She does have some postnasal drainage.          Allergies  Allergen Reactions   Cephalexin Hives    Pt can take PCN's Pt can take PCN's Pt can take PCN's    Macrobid [Nitrofurantoin]    Nitrofurantoin Macrocrystal Hives    Immunization History  Administered Date(s) Administered   Influenza,inj,Quad PF,6+ Mos 10/17/2020, 10/07/2021   Influenza-Unspecified 09/14/2018   Moderna Sars-Covid-2 Vaccination 03/12/2019, 04/09/2019, 11/29/2019   Tdap 03/09/2013, 04/27/2013   Tetanus 08/08/2005    Past Medical  History:  Diagnosis Date   Allergy    Anxiety    Clotting disorder (HCC)    PAI-1; on heparin   Depression    Lichen sclerosus    Status post repeat low transverse cesarean section (4/11) 04/23/2013    Tobacco History: Social History   Tobacco Use  Smoking Status Never  Smokeless Tobacco Never   Counseling given: Not Answered   Outpatient Medications Prior to Visit  Medication Sig Dispense Refill   albuterol (VENTOLIN HFA) 108 (90 Base) MCG/ACT inhaler INHALE 1-2 PUFFS BY MOUTH EVERY 6 HOURS  AS NEEDED FOR WHEEZE OR SHORTNESS OF BREATH 8.5 each 6   augmented betamethasone dipropionate (DIPROLENE-AF) 0.05 % ointment SMARTSIG:sparingly Topical Daily     benzonatate (TESSALON) 200 MG capsule Take 1 capsule (200 mg total) by mouth 3 (three) times daily as needed. 45 capsule 1   BIOTIN PO Take by mouth.     fluticasone (FLONASE) 50 MCG/ACT nasal spray Place 1 spray into both nostrils daily. As needed     folic acid (FOLVITE) 1 MG tablet Take 1 tablet by mouth daily.     gabapentin (NEURONTIN) 100 MG capsule Take 200 mg by mouth at bedtime.     levocetirizine (XYZAL) 5 MG tablet Take 1 tablet (5 mg total) by mouth every evening. 90 tablet 3   methotrexate (RHEUMATREX) 2.5 MG tablet Take 5 tablets by mouth once a week.     montelukast (SINGULAIR) 10 MG tablet TAKE 1 TABLET BY MOUTH EVERYDAY AT BEDTIME 90 tablet 3   omeprazole (PRILOSEC) 40 MG capsule Take 1 capsule (40 mg total) by mouth in the morning and at bedtime. 180 capsule 1   predniSONE (DELTASONE) 20 MG tablet Take 1 tablet (20 mg total) by mouth daily with breakfast. 5 tablet 0   valACYclovir (VALTREX) 500 MG tablet Take 1 tablet (500 mg total) by mouth daily. 90 tablet 3   No facility-administered medications prior to visit.     Review of Systems:   Constitutional:   No  weight loss, night sweats,  Fevers, chills, fatigue, or  lassitude.  HEENT:   No headaches,  Difficulty swallowing,  Tooth/dental problems, or  Sore throat,                No sneezing, itching, ear ache, +nasal congestion, post nasal drip,   CV:  No chest pain,  Orthopnea, PND, swelling in lower extremities, anasarca, dizziness, palpitations, syncope.   GI  No heartburn, indigestion, abdominal pain, nausea, vomiting, diarrhea, change in bowel habits, loss of appetite, bloody stools.   Resp:  No chest wall deformity  Skin: no rash or lesions.  GU: no dysuria, change in color of urine, no urgency or frequency.  No flank pain, no hematuria   MS:  No  joint pain or swelling.  No decreased range of motion.  No back pain.    Physical Exam  BP 110/70 (BP Location: Left Arm, Patient Position: Sitting, Cuff Size: Large)   Pulse (!) 103   Temp 98.1 F (36.7 C) (Oral)   Ht 5\' 7"  (1.702 m)   Wt 254 lb 12.8 oz (115.6 kg)   SpO2 100%   BMI 39.91 kg/m   GEN: A/Ox3; pleasant , NAD, well nourished    HEENT:  [place]/AT,   NOSE-clear, THROAT-clear, no lesions, no postnasal drip or exudate noted.   NECK:  Supple w/ fair ROM; no JVD; normal carotid impulses w/o bruits; no thyromegaly or nodules palpated; no lymphadenopathy.  RESP  Clear  P & A; w/o, wheezes/ rales/ or rhonchi. no accessory muscle use, no dullness to percussion  CARD:  RRR, no m/r/g, no peripheral edema, pulses intact, no cyanosis or clubbing.  GI:   Soft & nt; nml bowel sounds; no organomegaly or masses detected.   Musco: Warm bil, no deformities or joint swelling noted.   Neuro: alert, no focal deficits noted.    Skin: Warm, no lesions or rashes    Lab Results:  CBC   BNP No results found for: "BNP"  ProBNP No results found for: "PROBNP"  Imaging: DG Chest 2 View  Result Date: 02/11/2022 CLINICAL DATA:  Cough for 4 weeks.  On methotrexate. EXAM: CHEST - 2 VIEW COMPARISON:  Chest two views 10/17/2020 FINDINGS: Cardiac silhouette and mediastinal contours are within normal limits. The lungs are clear. No pleural effusion or pneumothorax. No acute skeletal abnormality. IMPRESSION: No active cardiopulmonary disease. Electronically Signed   By: Neita Garnet M.D.   On: 02/11/2022 15:30          No data to display          Lab Results  Component Value Date   NITRICOXIDE 18 10/17/2020        Assessment & Plan:   Asthma Slow to resolve asthmatic flare after recent URI with postviral cough.  Will begin Arnuity daily  Continue to control for triggers.  Asthma action plan discussed.  Albuterol as needed  Plan  Patient Instructions  Begin Arnuity 1  puff daily, rinse after use.  Tessalon Three times a day  for cough , as needed  Codeine cough syrup As needed, may make you sleepy  Continue on Xyzal 5mg  daily  Continue on Flonase daily  Saline spray Twice daily   Saline nasal gel At bedtime   Albuterol inhaler 1-2 puffs every 6hr as needed  Singulair 10mg  At bedtime   Refer to Cardiology.  Call back if hoarseness does not resolve, will need ENT referral .  Follow up with Dr.  or Joeann Steppe NP in 6-8 weeks and As needed   Please contact office for sooner follow up if symptoms do not improve or worsen or seek emergency care       Allergic rhinitis Continue to control for triggers.  Atrial fibrillation (HCC) Brief episode of irregular heart beat and palpitations.  Apple Watch did show episode of brief episode of A-fib. Unclear if this is related to Delsym cough syrup or not.  She also was on prednisone and albuterol during acute illness. We discussed avoiding Delsym at this time.  Avoid decongestants. Recent TSH was normal.  Patient has a strong family history of coronary disease. She does have some intermittent snoring.  But no significant sleep issues.  If workup reveals underlying A-fib would consider a sleep study to rule out underlying sleep apnea  Plan  Patient Instructions  Begin Arnuity 1 puff daily, rinse after use.  Tessalon Three times a day  for cough , as needed  Codeine cough syrup As needed, may make you sleepy  Continue on Xyzal 5mg  daily  Continue on Flonase daily  Saline spray Twice daily   Saline nasal gel At bedtime   Albuterol inhaler 1-2 puffs every 6hr as needed  Singulair 10mg  At bedtime   Refer to Cardiology.  Call back if hoarseness does not resolve, will need ENT referral .  Follow up with Dr.  or Kenroy Timberman NP in 6-8 weeks and As needed  Please contact office for sooner follow up if symptoms do not improve or worsen or seek emergency care        I spent 40   minutes dedicated to the  care of this patient on the date of this encounter to include pre-visit review of records, face-to-face time with the patient discussing conditions above, post visit ordering of testing, clinical documentation with the electronic health record, making appropriate referrals as documented, and communicating necessary findings to members of the patients care team.   Rexene Edison, NP 02/18/2022

## 2022-02-18 NOTE — Assessment & Plan Note (Signed)
Continue to control for triggers. 

## 2022-02-18 NOTE — Patient Instructions (Addendum)
Begin Arnuity 1 puff daily, rinse after use.  Tessalon Three times a day  for cough , as needed  Codeine cough syrup As needed, may make you sleepy  Continue on Xyzal 5mg  daily  Continue on Flonase daily  Saline spray Twice daily   Saline nasal gel At bedtime   Albuterol inhaler 1-2 puffs every 6hr as needed  Singulair 10mg  At bedtime   Refer to Cardiology.  Call back if hoarseness does not resolve, will need ENT referral .  Follow up with Dr. Erin Fulling  or Jazzalyn Loewenstein NP in 6-8 weeks and As needed   Please contact office for sooner follow up if symptoms do not improve or worsen or seek emergency care

## 2022-02-21 ENCOUNTER — Other Ambulatory Visit: Payer: Self-pay

## 2022-02-21 MED ORDER — FLUTICASONE PROPIONATE DISKUS 100 MCG/ACT IN AEPB: 1.0000 | INHALATION_SPRAY | Freq: Two times a day (BID) | RESPIRATORY_TRACT | 5 refills | Status: DC

## 2022-02-21 NOTE — Telephone Encounter (Signed)
Mychart message sent by pt:  Mariah Brown [facility] (supporting Tammy S Parrett, NP)23 minutes ago (2:22 PM)    I was hoping to hear from a cardiologist..any idea when they will call to schedule an appointment?     Checking with [facility] to see which cardiology office referral went to. Please advise.

## 2022-02-25 ENCOUNTER — Encounter: Payer: Self-pay | Admitting: Cardiovascular Disease

## 2022-02-25 ENCOUNTER — Ambulatory Visit: Payer: BC Managed Care – PPO | Attending: Cardiovascular Disease | Admitting: Cardiovascular Disease

## 2022-02-25 VITALS — BP 118/72 | HR 97 | Ht 67.0 in | Wt 256.8 lb

## 2022-02-25 DIAGNOSIS — I480 Paroxysmal atrial fibrillation: Secondary | ICD-10-CM

## 2022-02-25 NOTE — Patient Instructions (Signed)
Medication Instructions:  Your physician recommends that you continue on your current medications as directed. Please refer to the Current Medication list given to you today.  *If you need a refill on your cardiac medications before your next appointment, please call your pharmacy*   Lab Work: NONE If you have labs (blood work) drawn today and your tests are completely normal, you will receive your results only by: [place] (if you have MyChart) OR A paper copy in the mail If you have any lab test that is abnormal or we need to change your treatment, we will call you to review the results.  Testing/Procedures: ECHO Your physician has requested that you have an echocardiogram. Echocardiography is a painless test that uses sound waves to create images of your heart. It provides your doctor with information about the size and shape of your heart and how well your heart's chambers and valves are working. This procedure takes approximately one hour. There are no restrictions for this procedure. Please do NOT wear cologne, perfume, aftershave, or lotions (deodorant is allowed). Please arrive 15 minutes prior to your appointment time.  Follow-Up: At [facility], you and your health needs are our priority.  As part of our continuing mission to provide you with exceptional heart care, we have created designated Provider Care Teams.  These Care Teams include your primary Cardiologist (physician) and Advanced Practice Providers (APPs -  Physician Assistants and Nurse Practitioners) who all work together to provide you with the care you need, when you need it.  Your next appointment:   1 year(s)  Provider:   Mertie Moores, MD

## 2022-02-25 NOTE — Progress Notes (Signed)
Cardiology Office Note:    Date:  02/25/2022   ID:  Mariah Brown, DOB 05/22/1975, MRN [id number]  PCP:  Tawnya Crook, MD   [place] [facility] Cardiologist:  Naomii Kreger     Referring MD: Tawnya Crook, MD   Chief Complaint  Patient presents with   Atrial Fibrillation         History of Present Illness:    Mariah Brown is a 47 y.o. female with a hx of atrial fib , Has a hx of hypercoagulably ( PAI-1 4G5G gene mutation)    Has had a cough for a long time Sees Carolynn Serve, NP  Had a bad cough.  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,          Past Medical History:  Diagnosis Date   Allergy    Anxiety    Clotting disorder ([place])    PAI-1; on heparin   Depression    Lichen sclerosus    Status post repeat low transverse cesarean section (4/11) 04/23/2013    Past Surgical History:  Procedure Laterality Date   CESAREAN SECTION  2012   CESAREAN SECTION N/A 04/23/2013   Procedure: Repeat Cesarean Section Delivery Baby Boy @ 1122, Apgars 9/9;  Surgeon: Lovenia Kim, MD;  Location: [facility];  Service: Obstetrics;  Laterality: N/A;    Current Medications: Current Meds  Medication Sig   albuterol (VENTOLIN HFA) 108 (90 Base) MCG/ACT inhaler INHALE 1-2 PUFFS BY MOUTH EVERY 6 HOURS AS NEEDED FOR WHEEZE OR SHORTNESS OF BREATH   augmented betamethasone dipropionate (DIPROLENE-AF)  0.05 % ointment SMARTSIG:sparingly Topical Daily   benzonatate (TESSALON) 200 MG capsule Take 1 capsule (200 mg total) by mouth 3 (three) times daily as needed.   BIOTIN PO Take by mouth.   fluticasone (FLONASE) 50 MCG/ACT nasal spray Place 1 spray into both nostrils daily. As needed   Fluticasone Propionate, Inhal, (FLUTICASONE PROPIONATE DISKUS) 100 MCG/ACT AEPB Inhale 1 puff into the lungs 2 (two) times daily.   folic acid (FOLVITE) 1 MG tablet Take 1 tablet by mouth daily.   gabapentin (NEURONTIN) 100 MG capsule Take 200 mg by mouth at bedtime.   guaiFENesin-codeine (ROBITUSSIN AC) 100-10 MG/5ML syrup Take 5 mLs by mouth 3 (three) times daily as needed for cough.   levocetirizine (XYZAL) 5 MG tablet Take 1 tablet (5 mg total) by mouth every evening.   methotrexate (RHEUMATREX) 2.5 MG tablet Take 5 tablets by mouth once a week.   montelukast (SINGULAIR) 10 MG tablet TAKE 1 TABLET BY MOUTH EVERYDAY AT BEDTIME   omeprazole (PRILOSEC) 40 MG capsule Take 1 capsule (40 mg total) by mouth in the morning and at bedtime.   predniSONE (DELTASONE) 20 MG tablet Take 1 tablet (20 mg total) by mouth daily with breakfast.  valACYclovir (VALTREX) 500 MG tablet Take 1 tablet (500 mg total) by mouth daily.     Allergies:   Cephalexin, Macrobid [nitrofurantoin], and Nitrofurantoin macrocrystal   Social History   Socioeconomic History   Marital status: Married    Spouse name: Not on file   Number of children: 2   Years of education: Not on file   Highest education level: Not on file  Occupational History   Not on file  Tobacco Use   Smoking status: Never   Smokeless tobacco: Never  Vaping Use   Vaping Use: Never used  Substance and Sexual Activity   Alcohol use: Yes    Alcohol/week: 2.0 standard drinks of alcohol    Types: 1 Glasses of wine, 1 Cans of beer per week   Drug use: Never   Sexual activity: Yes    Birth control/protection: Condom, Rhythm  Other Topics Concern   Not on file   Social History Narrative   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[employer]   Social Determinants of Health   Financial Resource Strain: Not on file  Food Insecurity: No Food Insecurity (08/21/2021)   Hunger Vital Sign    Worried About Running Out of Food in the Last Year: Never true    Ran Out of Food in the Last Year: Never true  Transportation Needs: Not on file  Physical Activity: Not on file  Stress: Not on file  Social Connections: Not on file     Family History: The patient's family history includes Alcohol abuse in her maternal grandfather and mother; Birth defects in her brother; Breast cancer (age of onset: 50) in her maternal grandmother; Cancer in her mother; Depression in her mother; Early death in her maternal grandfather and maternal grandmother; Heart attack in her maternal grandfather and paternal grandfather; Heart disease in her brother; Hyperlipidemia in her mother; Hypertension in her father; Mental illness in her maternal grandfather and mother.  ROS:   Please see the history of present illness.     All other systems reviewed and are negative.  EKGs/Labs/Other Studies Reviewed:    The following studies were reviewed today:   EKG:     Recent Labs: 10/07/2021: ALT 14; BUN 16; Creatinine, Ser 1.05; Hemoglobin 12.4; Platelets 205.0; Potassium 3.6; Sodium 138; TSH 2.20  Recent Lipid Panel    Component Value Date/Time   CHOL 158 10/07/2021 1630   TRIG 106.0 10/07/2021 1630   HDL 41.10 10/07/2021 1630   CHOLHDL 4 10/07/2021 1630   VLDL 21.2 10/07/2021 1630   LDLCALC 95 10/07/2021 1630     Risk Assessment/Calculations:    CHA2DS2-VASc Score = 1   This indicates a 0.6% annual risk of stroke. The patient's score is based upon: CHF History: 0 HTN History: 0 Diabetes History: 0 Stroke History: 0 Vascular Disease History: 0 Age Score: 0 Gender Score: 1                Physical Exam:    VS:  BP 118/72   Pulse 97   Ht 5' 7"$  (1.702 m)   Wt 256 lb 12.8 oz (116.5  kg)   SpO2 97%   BMI 40.22 kg/m     Wt Readings from Last 3 Encounters:  02/25/22 256 lb 12.8 oz (116.5 kg)  02/18/22 254 lb 12.8 oz (115.6 kg)  02/11/22 254 lb 3.2 oz (115.3 kg)     GEN:  Well nourished, well developed in no acute distress HEENT: Normal NECK: No JVD; No carotid bruits LYMPHATICS: No  lymphadenopathy CARDIAC: RRR, no murmurs, rubs, gallops RESPIRATORY:  Clear to auscultation without rales, wheezing or rhonchi  ABDOMEN: Soft, non-tender, non-distended MUSCULOSKELETAL:  No edema; No deformity  SKIN: Warm and dry NEUROLOGIC:  Alert and oriented x 3 PSYCHIATRIC:  Normal affect   ASSESSMENT:    1. PAF (paroxysmal atrial fibrillation) (HCC)    PLAN:    In order of problems listed above:  Paroxysmal atrial fibrillation: Mariah Brown presents for further evaluation of 2 relatively brief episodes of paroxysmal atrial fibrillation.  Both of these occurred during or just after a pulmonary illness.  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  Has 2 children now ( was on heparin throughout her pregnancies )    I have sent a message to Lattie Corns (geneticist) to help advise me on whether or not this gene mutation would cause [place] to treat her with Eliquis despite her otherwise low CHADS2VASC score .    Will see her in 1 year           Medication Adjustments/Labs and Tests Ordered: Current medicines are reviewed at length with the patient today.  Concerns regarding medicines are outlined above.  Orders Placed This Encounter  Procedures   ECHOCARDIOGRAM COMPLETE   No orders of the defined types were placed in this encounter.   Patient Instructions  Medication Instructions:  Your physician recommends that you continue on your current medications as directed. Please refer to the Current Medication list given to you today.  *If you need a refill on your cardiac medications  before your next appointment, please call your pharmacy*   Lab Work: NONE If you have labs (blood work) drawn today and your tests are completely normal, you will receive your results only by: [place] (if you have MyChart) OR A paper copy in the mail If you have any lab test that is abnormal or we need to change your treatment, we will call you to review the results.  Testing/Procedures: ECHO Your physician has requested that you have an echocardiogram. Echocardiography is a painless test that uses sound waves to create images of your heart. It provides your doctor with information about the size and shape of your heart and how well your heart's chambers and valves are working. This procedure takes approximately one hour. There are no restrictions for this procedure. Please do NOT wear cologne, perfume, aftershave, or lotions (deodorant is allowed). Please arrive 15 minutes prior to your appointment time.  Follow-Up: At [facility], you and your health needs are our priority.  As part of our continuing mission to provide you with exceptional heart care, we have created designated Provider Care Teams.  These Care Teams include your primary Cardiologist (physician) and Advanced Practice Providers (APPs -  Physician Assistants and Nurse Practitioners) who all work together to provide you with the care you need, when you need it.  Your next appointment:   1 year(s)  Provider:   Mertie Moores, MD     Signed, Mertie Moores, MD  02/25/2022 3:11 PM    [place]

## 2022-02-27 ENCOUNTER — Ambulatory Visit (HOSPITAL_COMMUNITY): Payer: BC Managed Care – PPO | Attending: Cardiology

## 2022-02-27 DIAGNOSIS — I480 Paroxysmal atrial fibrillation: Secondary | ICD-10-CM

## 2022-02-27 LAB — ECHOCARDIOGRAM COMPLETE
Area-P 1/2: 4.57 cm2
S' Lateral: 3.1 cm

## 2022-04-19 ENCOUNTER — Other Ambulatory Visit (HOSPITAL_BASED_OUTPATIENT_CLINIC_OR_DEPARTMENT_OTHER): Payer: Self-pay

## 2022-04-19 ENCOUNTER — Encounter (HOSPITAL_BASED_OUTPATIENT_CLINIC_OR_DEPARTMENT_OTHER): Payer: Self-pay

## 2022-04-19 MED ORDER — SEMAGLUTIDE-WEIGHT MANAGEMENT 0.5 MG/0.5ML ~~LOC~~ SOAJ
0.5000 mg | SUBCUTANEOUS | 0 refills | Status: DC
  Filled 2022-04-19 – 2022-04-23 (×4): qty 2, 28d supply, fill #0

## 2022-04-20 ENCOUNTER — Other Ambulatory Visit (HOSPITAL_BASED_OUTPATIENT_CLINIC_OR_DEPARTMENT_OTHER): Payer: Self-pay

## 2022-04-21 ENCOUNTER — Other Ambulatory Visit (HOSPITAL_BASED_OUTPATIENT_CLINIC_OR_DEPARTMENT_OTHER): Payer: Self-pay

## 2022-04-22 ENCOUNTER — Encounter: Payer: Self-pay | Admitting: Family Medicine

## 2022-04-22 ENCOUNTER — Other Ambulatory Visit (HOSPITAL_BASED_OUTPATIENT_CLINIC_OR_DEPARTMENT_OTHER): Payer: Self-pay

## 2022-04-22 ENCOUNTER — Encounter: Payer: Self-pay | Admitting: Cardiovascular Disease

## 2022-04-23 ENCOUNTER — Other Ambulatory Visit: Payer: Self-pay

## 2022-04-26 ENCOUNTER — Other Ambulatory Visit (HOSPITAL_BASED_OUTPATIENT_CLINIC_OR_DEPARTMENT_OTHER): Payer: Self-pay

## 2022-04-30 ENCOUNTER — Other Ambulatory Visit (HOSPITAL_BASED_OUTPATIENT_CLINIC_OR_DEPARTMENT_OTHER): Payer: Self-pay

## 2022-04-30 ENCOUNTER — Other Ambulatory Visit: Payer: Self-pay | Admitting: *Deleted

## 2022-04-30 ENCOUNTER — Other Ambulatory Visit: Payer: Self-pay

## 2022-04-30 DIAGNOSIS — I480 Paroxysmal atrial fibrillation: Secondary | ICD-10-CM

## 2022-04-30 MED ORDER — SEMAGLUTIDE-WEIGHT MANAGEMENT 0.25 MG/0.5ML ~~LOC~~ SOAJ
0.2500 mg | SUBCUTANEOUS | 0 refills | Status: AC
  Filled 2022-04-30 – 2022-05-14 (×2): qty 2, 28d supply, fill #0

## 2022-05-01 ENCOUNTER — Other Ambulatory Visit (HOSPITAL_BASED_OUTPATIENT_CLINIC_OR_DEPARTMENT_OTHER): Payer: Self-pay

## 2022-05-14 ENCOUNTER — Other Ambulatory Visit (HOSPITAL_BASED_OUTPATIENT_CLINIC_OR_DEPARTMENT_OTHER): Payer: Self-pay

## 2022-06-13 ENCOUNTER — Other Ambulatory Visit (HOSPITAL_BASED_OUTPATIENT_CLINIC_OR_DEPARTMENT_OTHER): Payer: Self-pay

## 2022-11-21 ENCOUNTER — Ambulatory Visit: Payer: BC Managed Care – PPO | Admitting: Family

## 2022-11-21 ENCOUNTER — Encounter: Payer: Self-pay | Admitting: Family

## 2022-11-21 VITALS — BP 124/85 | HR 93 | Temp 98.0°F | Ht 67.0 in | Wt 250.6 lb

## 2022-11-21 DIAGNOSIS — J020 Streptococcal pharyngitis: Secondary | ICD-10-CM

## 2022-11-21 LAB — POCT RAPID STREP A (OFFICE): Rapid Strep A Screen: POSITIVE — AB

## 2022-11-21 MED ORDER — AMOXICILLIN 500 MG PO CAPS: 500.0000 mg | ORAL_CAPSULE | Freq: Two times a day (BID) | ORAL | 0 refills | Status: DC

## 2022-11-21 MED ORDER — AMOXICILLIN 500 MG PO CAPS: 500.0000 mg | ORAL_CAPSULE | Freq: Two times a day (BID) | ORAL | 0 refills | Status: AC

## 2022-11-21 MED ORDER — LIDOCAINE VISCOUS HCL 2 % MT SOLN: 15.0000 mL | OROMUCOSAL | 0 refills | Status: DC | PRN

## 2022-11-21 NOTE — Progress Notes (Signed)
Patient ID: Mariah Brown, female    DOB: 1975-04-07, 47 y.o.   MRN: [id number]  Chief Complaint  Patient presents with   Sore Throat    Pt c/o Right sided sore throat, present for 2 weeks and has been getting worse. Has tried Nyquil. Pt states she is unable to swallow.    Discussed the use of AI scribe software for clinical note transcription with the patient, who gave verbal consent to proceed.  History of Present Illness   The patient, a school teacher, with a history of strep throat in 2019, presents with a two-week history of a sore throat. She initially attributed the symptoms to allergies, as she had been cleaning extensively after her children had a stomach bug. The sore throat is associated with a headache, earache, and jaw pain, which is exacerbated by chewing and swallowing. She also reports a sore on her tonsil, which she describes as looking like a canker sore. She has tried Valtrex without relief. She denies fever and cough.     Assessment & Plan:     Strep Throat - Positive rapid strep test. Symptoms include sore throat, swollen tonsils, and swollen lymph node. No fever or cough. History of strep throat in 2019. -Starting Amoxicillin bid x 10d,  two doses today. -Advised to take Ibuprofen up to 600mg  tid prn for pain and inflammation. -Prescribed Lidocaine rinse for pain relief.  - Advised on use & SE of all meds above.    Subjective:    Outpatient Medications Prior to Visit  Medication Sig Dispense Refill   albuterol (VENTOLIN HFA) 108 (90 Base) MCG/ACT inhaler INHALE 1-2 PUFFS BY MOUTH EVERY 6 HOURS AS NEEDED FOR WHEEZE OR SHORTNESS OF BREATH 8.5 each 6   augmented betamethasone dipropionate (DIPROLENE-AF) 0.05 % ointment SMARTSIG:sparingly Topical Daily     BIOTIN PO Take by mouth.     fluticasone (FLONASE) 50 MCG/ACT nasal spray Place 1 spray into both nostrils daily. As needed     Fluticasone Propionate, Inhal, (FLUTICASONE PROPIONATE DISKUS) 100 MCG/ACT AEPB  Inhale 1 puff into the lungs 2 (two) times daily. 3 each 5   folic acid (FOLVITE) 1 MG tablet Take 1 tablet by mouth daily.     gabapentin (NEURONTIN) 100 MG capsule Take 200 mg by mouth at bedtime.     levocetirizine (XYZAL) 5 MG tablet Take 1 tablet (5 mg total) by mouth every evening. 90 tablet 3   methotrexate (RHEUMATREX) 2.5 MG tablet Take 5 tablets by mouth once a week.     montelukast (SINGULAIR) 10 MG tablet TAKE 1 TABLET BY MOUTH EVERYDAY AT BEDTIME 90 tablet 3   omeprazole (PRILOSEC) 40 MG capsule Take 1 capsule (40 mg total) by mouth in the morning and at bedtime. 180 capsule 1   predniSONE (DELTASONE) 20 MG tablet Take 1 tablet (20 mg total) by mouth daily with breakfast. 5 tablet 0   Semaglutide-Weight Management 0.5 MG/0.5ML SOAJ Inject 0.5 mg into the skin once a week. 2 mL 0   valACYclovir (VALTREX) 500 MG tablet Take 1 tablet (500 mg total) by mouth daily. 90 tablet 3   benzonatate (TESSALON) 200 MG capsule Take 1 capsule (200 mg total) by mouth 3 (three) times daily as needed. (Patient not taking: Reported on 11/21/2022) 45 capsule 1   guaiFENesin-codeine (ROBITUSSIN AC) 100-10 MG/5ML syrup Take 5 mLs by mouth 3 (three) times daily as needed for cough. (Patient not taking: Reported on 11/21/2022) 120 mL 0   No facility-administered  medications prior to visit.   Past Medical History:  Diagnosis Date   Allergy    Anxiety    Clotting disorder (HCC)    PAI-1; on heparin   Depression    Lichen sclerosus    Status post repeat low transverse cesarean section (4/11) 04/23/2013   Past Surgical History:  Procedure Laterality Date   CESAREAN SECTION  2012   CESAREAN SECTION N/A 04/23/2013   Procedure: Repeat Cesarean Section Delivery Baby Boy @ 1122, Apgars 9/9;  Surgeon: Lenoard Aden, MD;  Location: [facility];  Service: Obstetrics;  Laterality: N/A;   Allergies  Allergen Reactions   Cephalexin Hives    Pt can take PCN's Pt can take PCN's Pt can take PCN's    Macrobid  [Nitrofurantoin]    Nitrofurantoin Macrocrystal Hives      Objective:    Physical Exam Vitals and nursing note reviewed.  Constitutional:      Appearance: Normal appearance. She is ill-appearing.     Interventions: Face mask in place.  HENT:     Right Ear: Tympanic membrane and ear canal normal.     Left Ear: Tympanic membrane and ear canal normal.     Nose:     Right Sinus: No frontal sinus tenderness.     Left Sinus: No frontal sinus tenderness.     Mouth/Throat:     Mouth: Mucous membranes are moist.     Pharynx: Posterior oropharyngeal erythema present. No pharyngeal swelling, oropharyngeal exudate or uvula swelling.     Tonsils: No tonsillar exudate or tonsillar abscesses. 2+ on the right. 1+ on the left.  Cardiovascular:     Rate and Rhythm: Normal rate and regular rhythm.  Pulmonary:     Effort: Pulmonary effort is normal.     Breath sounds: Normal breath sounds.  Musculoskeletal:        General: Normal range of motion.  Lymphadenopathy:     Head:     Right side of head: No preauricular or posterior auricular adenopathy.     Left side of head: No preauricular or posterior auricular adenopathy.     Cervical: No cervical adenopathy.  Skin:    General: Skin is warm and dry.  Neurological:     Mental Status: She is alert.  Psychiatric:        Mood and Affect: Mood normal.        Behavior: Behavior normal.    Temp 98 F (36.7 C) (Temporal)   Ht 5\' 7"  (1.702 m)   Wt 250 lb 9.6 oz (113.7 kg)   BMI 39.25 kg/m  Wt Readings from Last 3 Encounters:  11/21/22 250 lb 9.6 oz (113.7 kg)  02/25/22 256 lb 12.8 oz (116.5 kg)  02/18/22 254 lb 12.8 oz (115.6 kg)      Dulce Sellar, NP

## 2022-12-27 ENCOUNTER — Other Ambulatory Visit: Payer: Self-pay | Admitting: Adult Health

## 2023-02-19 ENCOUNTER — Ambulatory Visit: Payer: BC Managed Care – PPO | Admitting: Pulmonary Disease

## 2023-02-25 ENCOUNTER — Other Ambulatory Visit: Payer: Self-pay | Admitting: Adult Health

## 2023-03-12 ENCOUNTER — Encounter: Payer: Self-pay | Admitting: Pulmonary Disease

## 2023-03-12 ENCOUNTER — Ambulatory Visit: Payer: 59 | Admitting: Pulmonary Disease

## 2023-03-12 VITALS — BP 119/82 | HR 93 | Ht 67.0 in | Wt 252.6 lb

## 2023-03-12 DIAGNOSIS — J4530 Mild persistent asthma, uncomplicated: Secondary | ICD-10-CM

## 2023-03-12 DIAGNOSIS — J301 Allergic rhinitis due to pollen: Secondary | ICD-10-CM | POA: Diagnosis not present

## 2023-03-12 MED ORDER — BUDESONIDE-FORMOTEROL FUMARATE 80-4.5 MCG/ACT IN AERO
2.0000 | INHALATION_SPRAY | Freq: Two times a day (BID) | RESPIRATORY_TRACT | 12 refills | Status: AC
Start: 2023-03-12 — End: ?

## 2023-03-12 MED ORDER — LEVOCETIRIZINE DIHYDROCHLORIDE 5 MG PO TABS: 5.0000 mg | ORAL_TABLET | Freq: Every evening | ORAL | 3 refills | Status: AC

## 2023-03-12 MED ORDER — ALBUTEROL SULFATE HFA 108 (90 BASE) MCG/ACT IN AERS: 1.0000 | INHALATION_SPRAY | RESPIRATORY_TRACT | 6 refills | Status: AC | PRN

## 2023-03-12 MED ORDER — MONTELUKAST SODIUM 10 MG PO TABS: 10.0000 mg | ORAL_TABLET | Freq: Every day | ORAL | 3 refills | Status: AC

## 2023-03-12 NOTE — Progress Notes (Signed)
 Synopsis: Hx of asthma  Subjective:   PATIENT ID: Mariah Brown GENDER: female DOB: [birth year], MRN: [id number]   HPI  Chief Complaint  Patient presents with   Follow-up   Mariah Brown is a 48 year old female with lichen sclerosus who presents with chronic cough and asthma.  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 Methotrexate has been part of her treatment for lichen sclerosus for approximately three years. She recently held the medication for two weeks to manage an acute infection. During a recent trip, she used Nyquil and Mucinex, finding the latter made her cough more productive. She uses an inhaler for temporary relief but continues to experience a 'rattle' in her chest. Occasional production of green mucus is noted, and seasonal changes and exposure to smoke exacerbate her symptoms. She has a history of using codeine cough syrup during severe episodes and uses albuterol as needed.  She is highly sensitive to cigarette smoke and other strong odors, which trigger prolonged coughing episodes. She takes Singulair and carries a rescue inhaler (albuterol) for these situations. She has not been able to obtain a steroid inhaler due to insurance issues. Her respiratory symptoms are exacerbated by seasonal changes and exposure to smoke.  Her history of lichen sclerosus was exacerbated following a severe immune response to a COVID booster shot. This condition has been managed with methotrexate and gabapentin, which have helped control her symptoms. She anticipates a potential change in her treatment plan during her upcoming dermatology appointment.  She has experienced atrial fibrillation, which she suspects may be related to hormonal changes associated with menopause. She has had her heart evaluated, including an echocardiogram, which showed a slight leak but no significant  changes over the past ten years. She has a history of fluid retention post-breastfeeding, initially suspected to be congestive heart failure, but this was not confirmed.  She has a history of allergy issues since childhood, including severe hay fever, and underwent allergy testing around 2022-2023, which showed high sensitivity to mold but not to grass or trees.  Past Medical History:  Diagnosis Date   Allergy    Anxiety    Clotting disorder (HCC)    PAI-1; on heparin   Depression    Lichen sclerosus    Status post repeat low transverse cesarean section (4/11) 04/23/2013     Family History  Problem Relation Age of Onset   Hyperlipidemia Mother    Depression Mother    Alcohol abuse Mother    Cancer Mother        colon   Mental illness Mother    Hypertension Father    Heart disease Brother    Birth defects Brother    Early death Maternal Grandmother    Breast cancer Maternal Grandmother 62   Alcohol abuse Maternal Grandfather    Mental illness Maternal Grandfather    Early death Maternal Grandfather    Heart attack Maternal Grandfather    Heart attack Paternal Grandfather      Social History   Socioeconomic History   Marital status: Married    Spouse name: Not on file   Number of children: 2   Years of education: Not on file   Highest education level: Not on file  Occupational History   Not on file  Tobacco Use   Smoking status: Never   Smokeless tobacco: Never  Vaping Use  Vaping status: Never Used  Substance and Sexual Activity   Alcohol use: Yes    Alcohol/week: 2.0 standard drinks of alcohol    Types: 1 Glasses of wine, 1 Cans of beer per week   Drug use: Never   Sexual activity: Yes    Birth control/protection: Condom, Rhythm  Other Topics Concern   Not on file  Social History Narrative   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[employer]   Social Drivers of Health   Financial Resource Strain: Not on file  Food Insecurity: No Food Insecurity (08/21/2021)   Hunger  Vital Sign    Worried About Running Out of Food in the Last Year: Never true    Ran Out of Food in the Last Year: Never true  Transportation Needs: Not on file  Physical Activity: Not on file  Stress: Not on file  Social Connections: Not on file  Intimate Partner Violence: Not on file     Allergies  Allergen Reactions   Cephalexin Hives    Pt can take PCN's Pt can take PCN's Pt can take PCN's    Macrobid [Nitrofurantoin]    Nitrofurantoin Macrocrystal Hives     Outpatient Medications Prior to Visit  Medication Sig Dispense Refill   augmented betamethasone dipropionate (DIPROLENE-AF) 0.05 % ointment SMARTSIG:sparingly Topical Daily     folic acid (FOLVITE) 1 MG tablet Take 1 tablet by mouth daily.     gabapentin (NEURONTIN) 100 MG capsule Take 200 mg by mouth at bedtime.     methotrexate (RHEUMATREX) 2.5 MG tablet Take 5 tablets by mouth once a week.     valACYclovir (VALTREX) 500 MG tablet Take 1 tablet (500 mg total) by mouth daily. 90 tablet 3   albuterol (VENTOLIN HFA) 108 (90 Base) MCG/ACT inhaler INHALE 1-2 PUFFS BY MOUTH EVERY 6 HOURS AS NEEDED FOR WHEEZE OR SHORTNESS OF BREATH 8.5 each 6   levocetirizine (XYZAL) 5 MG tablet Take 1 tablet (5 mg total) by mouth every evening. 90 tablet 3   montelukast (SINGULAIR) 10 MG tablet TAKE 1 TABLET BY MOUTH EVERYDAY AT BEDTIME 90 tablet 3   BIOTIN PO Take by mouth. (Patient not taking: Reported on 03/12/2023)     fluticasone (FLONASE) 50 MCG/ACT nasal spray Place 1 spray into both nostrils daily. As needed (Patient not taking: Reported on 03/12/2023)     Fluticasone Propionate, Inhal, (FLUTICASONE PROPIONATE DISKUS) 100 MCG/ACT AEPB Inhale 1 puff into the lungs 2 (two) times daily. (Patient not taking: Reported on 03/12/2023) 3 each 5   guaiFENesin-codeine (ROBITUSSIN AC) 100-10 MG/5ML syrup Take 5 mLs by mouth 3 (three) times daily as needed for cough. (Patient not taking: Reported on 03/12/2023) 120 mL 0   lidocaine (XYLOCAINE) 2 %  solution Use as directed 15 mLs in the mouth or throat every 3 (three) hours as needed for mouth pain (gargle; may spit or swallow). (Patient not taking: Reported on 03/12/2023) 100 mL 0   omeprazole (PRILOSEC) 40 MG capsule Take 1 capsule (40 mg total) by mouth in the morning and at bedtime. (Patient not taking: Reported on 03/12/2023) 180 capsule 1   predniSONE (DELTASONE) 20 MG tablet Take 1 tablet (20 mg total) by mouth daily with breakfast. (Patient not taking: Reported on 03/12/2023) 5 tablet 0   Semaglutide-Weight Management 0.5 MG/0.5ML SOAJ Inject 0.5 mg into the skin once a week. (Patient not taking: Reported on 03/12/2023) 2 mL 0   No facility-administered medications prior to visit.   Review of Systems  Constitutional:  Negative  for chills, fever, malaise/fatigue and weight loss.  HENT:  Negative for congestion, sinus pain and sore throat.   Eyes: Negative.   Respiratory:  Positive for cough. Negative for hemoptysis, sputum production, shortness of breath and wheezing.   Cardiovascular:  Negative for chest pain, palpitations, orthopnea, claudication and leg swelling.  Gastrointestinal:  Negative for abdominal pain, heartburn, nausea and vomiting.  Genitourinary: Negative.   Musculoskeletal:  Negative for joint pain and myalgias.  Skin:  Negative for rash.  Neurological:  Negative for weakness.  Endo/Heme/Allergies: Negative.   Psychiatric/Behavioral: Negative.     Objective:   Vitals:   03/12/23 1448  BP: 119/82  Pulse: 93  SpO2: 98%  Weight: 252 lb 9.6 oz (114.6 kg)  Height: 5\' 7"  (1.702 m)   Physical Exam Constitutional:      General: She is not in acute distress.    Appearance: Normal appearance. She is obese.  Eyes:     General: No scleral icterus.    Conjunctiva/sclera: Conjunctivae normal.  Cardiovascular:     Rate and Rhythm: Normal rate and regular rhythm.  Pulmonary:     Breath sounds: No wheezing, rhonchi or rales.  Musculoskeletal:     Right lower leg: No  edema.     Left lower leg: No edema.  Skin:    General: Skin is warm and dry.  Neurological:     General: No focal deficit present.    CBC    Component Value Date/Time   WBC 7.4 10/07/2021 1630   RBC 3.88 10/07/2021 1630   HGB 12.4 10/07/2021 1630   HGB 14.0 03/20/2009 1311   HCT 35.9 (L) 10/07/2021 1630   HCT 41.2 03/20/2009 1311   PLT 205.0 10/07/2021 1630   PLT 196 03/20/2009 1311   MCV 92.6 10/07/2021 1630   MCV 91 03/20/2009 1311   MCH 29.9 04/24/2013 0520   MCHC 34.5 10/07/2021 1630   RDW 13.4 10/07/2021 1630   RDW 11.4 03/20/2009 1311   LYMPHSABS 1.9 10/07/2021 1630   LYMPHSABS 2.7 03/20/2009 1311   MONOABS 0.4 10/07/2021 1630   EOSABS 0.1 10/07/2021 1630   EOSABS 0.6 (H) 03/20/2009 1311   BASOSABS 0.0 10/07/2021 1630   BASOSABS 0.1 03/20/2009 1311     Chest imaging: CXR 02/11/22 Cardiac silhouette and mediastinal contours are within normal limits. The lungs are clear. No pleural effusion or pneumothorax. No acute skeletal abnormality.  PFT:     No data to display          Labs:  Path:  Echo:  Heart Catheterization:    Assessment & Plan:   Mild persistent asthma, unspecified whether complicated - Plan: budesonide-formoterol (SYMBICORT) 80-4.5 MCG/ACT inhaler, albuterol (VENTOLIN HFA) 108 (90 Base) MCG/ACT inhaler, levocetirizine (XYZAL) 5 MG tablet  Seasonal allergic rhinitis due to pollen - Plan: montelukast (SINGULAIR) 10 MG tablet  Discussion: Mariah Brown is a 48 year old female with lichen sclerosus who presents with chronic cough and asthma.  Asthma Chronic Cough Persistent cough for the past two months, recently improved. History of sensitivity to smoke and strong smells. Currently on albuterol as needed. -Start Symbicort 80-4.47mcg inhaler, two puffs twice daily. -Continue albuterol as needed. -Check in 6 months to assess response to Symbicort.  Allergies History of severe hay fever and sensitivity to mold. Currently on  Xyzal and Singulair. -Refill Xyzal and Singulair prescriptions. -Continue current treatment.  Follow up in 6 months  Melody Comas, MD [facility] Office: [phone]  Current Outpatient Medications:    augmented betamethasone dipropionate (DIPROLENE-AF) 0.05 % ointment, SMARTSIG:sparingly Topical Daily, Disp: , Rfl:    budesonide-formoterol (SYMBICORT) 80-4.5 MCG/ACT inhaler, Inhale 2 puffs into the lungs 2 (two) times daily., Disp: 10.2 g, Rfl: 12   folic acid (FOLVITE) 1 MG tablet, Take 1 tablet by mouth daily., Disp: , Rfl:    gabapentin (NEURONTIN) 100 MG capsule, Take 200 mg by mouth at bedtime., Disp: , Rfl:    methotrexate (RHEUMATREX) 2.5 MG tablet, Take 5 tablets by mouth once a week., Disp: , Rfl:    valACYclovir (VALTREX) 500 MG tablet, Take 1 tablet (500 mg total) by mouth daily., Disp: 90 tablet, Rfl: 3   albuterol (VENTOLIN HFA) 108 (90 Base) MCG/ACT inhaler, Inhale 1-2 puffs into the lungs every 4 (four) hours as needed for wheezing or shortness of breath., Disp: 8.5 each, Rfl: 6   levocetirizine (XYZAL) 5 MG tablet, Take 1 tablet (5 mg total) by mouth every evening., Disp: 90 tablet, Rfl: 3   montelukast (SINGULAIR) 10 MG tablet, Take 1 tablet (10 mg total) by mouth at bedtime., Disp: 90 tablet, Rfl: 3

## 2023-03-12 NOTE — Patient Instructions (Addendum)
 Start symbicort inhaler 2 puffs twice daily - rinse mouth out after each use  Continue albuterol inhaler 1-2 puffs every 4-6 hours as needed  Continue montelukast 10mg  daily  Continue xyzal daily   Follow up in 6 months, call sooner if needed

## 2023-04-09 ENCOUNTER — Ambulatory Visit: Admitting: Family

## 2023-04-09 ENCOUNTER — Encounter: Payer: Self-pay | Admitting: Family

## 2023-04-09 VITALS — BP 119/81 | HR 83 | Temp 97.8°F | Ht 67.0 in | Wt 253.5 lb

## 2023-04-09 DIAGNOSIS — J0110 Acute frontal sinusitis, unspecified: Secondary | ICD-10-CM

## 2023-04-09 MED ORDER — AMOXICILLIN-POT CLAVULANATE 875-125 MG PO TABS: 1.0000 | ORAL_TABLET | Freq: Two times a day (BID) | ORAL | 0 refills | Status: AC

## 2023-04-09 NOTE — Progress Notes (Signed)
 Patient ID: Mariah Brown, female    DOB: 04/07/1975, 48 y.o.   MRN: [id number]  Chief Complaint  Patient presents with   Ear Fullness    Pt c/o left ear fullness and productive cough with green mucus. Present for 2-3 days, cough present for 2 weeks.        Discussed the use of AI scribe software for clinical note transcription with the patient, who gave verbal consent to proceed.  History of Present Illness Candace, a patient with a history of asthma and allergies, presents with a two-week history of upper respiratory symptoms that have worsened over the past few days. Initially, she experienced chest discomfort and sinus drainage, but now reports significant head pressure and ear pain, particularly on the left side. She describes the pain as being under her ear and in her jaw, and notes that her teeth hurt, especially on the left side. She also reports that her lymph nodes are sore. She has been taking Xyzal and Singulair for her allergies, and has added Nyquil, Mucinex, and Benadryl to manage her current symptoms. She also takes methotrexate for an unspecified condition and believes it has weakened her immune system, making her more susceptible to illness and prolonging her recovery time. She has not had a fever, but did experience chills at the onset of her illness. She also mentions that her family has been sick, with her youngest child recently testing positive for flu B.  Assessment & Plan Sinusitis/Otitis Media/Cough - Symptoms of sinusitis and otitis media likely exacerbated by seasonal allergies. Methotrexate use may contribute to prolonged recovery. - Prescribed Augmentin for 7 days, twice daily with food. - Continue Xyzal and Singulair for allergies. - Advised on Flonase for sinus and allergy symptoms, 1 squirt each nostril qd thru mid-May. - Suggested OTC saline nasal flushes or spray tid to flush pollen. - Recommended ibuprofen or Aleve for sinus pain, body aches, fever. -F/U  if no improvement in sx after finishing abt.    Subjective:    Outpatient Medications Prior to Visit  Medication Sig Dispense Refill   augmented betamethasone dipropionate (DIPROLENE-AF) 0.05 % ointment SMARTSIG:sparingly Topical Daily     budesonide-formoterol (SYMBICORT) 80-4.5 MCG/ACT inhaler Inhale 2 puffs into the lungs 2 (two) times daily. 10.2 g 12   folic acid (FOLVITE) 1 MG tablet Take 1 tablet by mouth daily.     gabapentin (NEURONTIN) 100 MG capsule Take 200 mg by mouth at bedtime.     levocetirizine (XYZAL) 5 MG tablet Take 1 tablet (5 mg total) by mouth every evening. 90 tablet 3   methotrexate (RHEUMATREX) 2.5 MG tablet Take 5 tablets by mouth once a week.     montelukast (SINGULAIR) 10 MG tablet Take 1 tablet (10 mg total) by mouth at bedtime. 90 tablet 3   valACYclovir (VALTREX) 500 MG tablet Take 1 tablet (500 mg total) by mouth daily. 90 tablet 3   albuterol (VENTOLIN HFA) 108 (90 Base) MCG/ACT inhaler Inhale 1-2 puffs into the lungs every 4 (four) hours as needed for wheezing or shortness of breath. (Patient not taking: Reported on 04/09/2023) 8.5 each 6   No facility-administered medications prior to visit.   Past Medical History:  Diagnosis Date   Allergy    Anxiety    Clotting disorder (HCC)    PAI-1; on heparin   Depression    Lichen sclerosus    Status post repeat low transverse cesarean section (4/11) 04/23/2013   Past Surgical History:  Procedure Laterality  Date   CESAREAN SECTION  2012   CESAREAN SECTION N/A 04/23/2013   Procedure: Repeat Cesarean Section Delivery Baby Boy @ 1122, Apgars 9/9;  Surgeon: Lenoard Aden, MD;  Location: [facility];  Service: Obstetrics;  Laterality: N/A;   Allergies  Allergen Reactions   Cephalexin Hives    Pt can take PCN's Pt can take PCN's Pt can take PCN's    Macrobid [Nitrofurantoin]    Nitrofurantoin Macrocrystal Hives      Objective:    Physical Exam Vitals and nursing note reviewed.  Constitutional:       Appearance: Normal appearance. She is ill-appearing.     Interventions: Face mask in place.  HENT:     Right Ear: Tympanic membrane and ear canal normal.     Left Ear: Ear canal normal.  No middle ear effusion. Tympanic membrane is injected and erythematous (mildly on edges). Tympanic membrane is not bulging.     Nose:     Right Sinus: Frontal sinus tenderness present.     Left Sinus: Frontal sinus tenderness present.     Mouth/Throat:     Mouth: Mucous membranes are moist.     Pharynx: No pharyngeal swelling, oropharyngeal exudate, posterior oropharyngeal erythema or uvula swelling.     Tonsils: No tonsillar exudate or tonsillar abscesses.  Cardiovascular:     Rate and Rhythm: Normal rate and regular rhythm.  Pulmonary:     Effort: Pulmonary effort is normal.     Breath sounds: Normal breath sounds.  Musculoskeletal:        General: Normal range of motion.  Lymphadenopathy:     Head:     Right side of head: No preauricular or posterior auricular adenopathy.     Left side of head: Preauricular and posterior auricular adenopathy present.     Cervical: No cervical adenopathy.  Skin:    General: Skin is warm and dry.  Neurological:     Mental Status: She is alert.  Psychiatric:        Mood and Affect: Mood normal.        Behavior: Behavior normal.    BP 119/81 (BP Location: Left Arm, Patient Position: Sitting, Cuff Size: Large)   Pulse 83   Temp 97.8 F (36.6 C) (Temporal)   Ht 5\' 7"  (1.702 m)   Wt 253 lb 8 oz (115 kg)   SpO2 99%   BMI 39.70 kg/m  Wt Readings from Last 3 Encounters:  04/09/23 253 lb 8 oz (115 kg)  03/12/23 252 lb 9.6 oz (114.6 kg)  11/21/22 250 lb 9.6 oz (113.7 kg)       Dulce Sellar, NP

## 2023-04-17 ENCOUNTER — Encounter: Payer: Self-pay | Admitting: Family

## 2023-04-17 ENCOUNTER — Ambulatory Visit: Payer: Self-pay

## 2023-04-17 NOTE — Telephone Encounter (Signed)
  Chief Complaint: left ear fullness/pressure Symptoms: left ear pressure, nasal congestion/runny nose Frequency: x 10 days Pertinent Negatives: Patient denies fever, difficulty breathing. Disposition: [] ED /[] Urgent Care (no appt availability in office) / [x] Appointment(In office/virtual)/ []  [facility]/ [] Home Care/ [] Refused Recommended Disposition /[] [facility]/ []  Follow-up with PCP Additional Notes: Patient seen on 04/09/23 and prescribed augmentin for 7 days. She states she has taken Xyzal, Singulair, Benadryl and Sudafed to try to help with symptoms. She states her nasal discharge has improved from yellow/green to clear. She states her left ear is a concern and the pressure is still bothersome. Unable to access schedule for [facility] providers due to [employer] providing error message after multiple attempts. Patient transferred to [facility] for scheduling.  Summary: pressure in left ear sinus infection   Copied From CRM [id number]. Reason for Triage: patient calling in taking antibiotic for sinus infection, the pressure in left ear is still there and would like an appt.  Patient phone [phone] ok to leave a         Reason for Disposition  Ear congestion present > 48 hours  Answer Assessment - Initial Assessment Questions 1. ANTIBIOTIC: "What antibiotic are you taking?" "How many times a day?"     Augmentin twice daily for 7 days, completed antibiotic.  2. ONSET: "When was the antibiotic started?"     04/09/23.  3. PAIN: "How bad is the sinus pain?"   (Scale 1-10; mild, moderate or severe)   - MILD (1-3): doesn't interfere with normal activities    - MODERATE (4-7): interferes with normal activities (e.g., work or school) or awakens from sleep   - SEVERE (8-10): excruciating pain and patient unable to do any normal activities        She states there is no pain just pressure.  4. FEVER: "Do you have a fever?" If Yes, ask: "What is it, how was it measured, and when  did it start?"      Denies.  5. SYMPTOMS: "Are there any other symptoms you're concerned about?" If Yes, ask: "When did it start?"     Left ear pressure feels like water in her ear or like she needs to pop it.  6. PREGNANCY: "Is there any chance you are pregnant?" "When was your last menstrual period?"    No chance, LMP a week ago.  Protocols used: Sinus Infection on Antibiotic Follow-up Call-[facility], Ear - Congestion-[facility]

## 2023-04-17 NOTE — Telephone Encounter (Signed)
 does she have an appointment with another clinic? if not, overall she sounds better but is she having ear pain or just congestion/muffled? if just muffled, then it can take up to 3-4 weeks to open up and clear and sometimes using Flonase or Nasacort nasal spray OTC can help. But if having pain it could be an infection in the ear and she needs to have it looked at. Thx

## 2023-05-05 ENCOUNTER — Encounter: Payer: Self-pay | Admitting: Gastroenterology

## 2023-06-15 ENCOUNTER — Encounter

## 2023-06-29 ENCOUNTER — Encounter: Admitting: Gastroenterology

## 2024-01-21 ENCOUNTER — Other Ambulatory Visit: Payer: Self-pay | Admitting: Obstetrics

## 2024-01-21 DIAGNOSIS — N644 Mastodynia: Secondary | ICD-10-CM

## 2024-03-07 ENCOUNTER — Other Ambulatory Visit

## 2024-03-07 ENCOUNTER — Encounter
